# Patient Record
Sex: Female | Born: 1950
Health system: Southern US, Community
[De-identification: ages and names within clinical notes are randomized; demographics above are authoritative.]

## PROBLEM LIST (undated history)

## (undated) DIAGNOSIS — E7800 Pure hypercholesterolemia, unspecified: Secondary | ICD-10-CM

## (undated) DIAGNOSIS — D693 Immune thrombocytopenic purpura: Secondary | ICD-10-CM

## (undated) DIAGNOSIS — E039 Hypothyroidism, unspecified: Secondary | ICD-10-CM

## (undated) DIAGNOSIS — M4727 Other spondylosis with radiculopathy, lumbosacral region: Secondary | ICD-10-CM

## (undated) DIAGNOSIS — M8580 Other specified disorders of bone density and structure, unspecified site: Secondary | ICD-10-CM

## (undated) DIAGNOSIS — I10 Essential (primary) hypertension: Secondary | ICD-10-CM

## (undated) DIAGNOSIS — Z8619 Personal history of other infectious and parasitic diseases: Secondary | ICD-10-CM

## (undated) DIAGNOSIS — K635 Polyp of colon: Secondary | ICD-10-CM

## (undated) DIAGNOSIS — E042 Nontoxic multinodular goiter: Secondary | ICD-10-CM

## (undated) HISTORY — PX: FRACTURE SURGERY: SHX138

## (undated) HISTORY — PX: OTHER SURGICAL HISTORY: SHX169

## (undated) HISTORY — PX: COLONOSCOPY: SHX174

## (undated) HISTORY — PX: TUBAL LIGATION: SHX77

## (undated) HISTORY — PX: APPENDECTOMY: SHX54

## (undated) HISTORY — PX: CHOLECYSTECTOMY: SHX55

---

## 1998-07-30 HISTORY — PX: LIVER SURGERY: SHX698

## 2005-04-19 ENCOUNTER — Ambulatory Visit: Payer: Self-pay | Admitting: Internal Medicine

## 2005-04-27 ENCOUNTER — Ambulatory Visit: Payer: Self-pay | Admitting: Internal Medicine

## 2006-05-09 ENCOUNTER — Ambulatory Visit: Payer: Self-pay | Admitting: Internal Medicine

## 2006-09-25 ENCOUNTER — Ambulatory Visit: Payer: Self-pay | Admitting: Unknown Physician Specialty

## 2007-06-17 ENCOUNTER — Ambulatory Visit: Payer: Self-pay | Admitting: Internal Medicine

## 2008-08-12 ENCOUNTER — Ambulatory Visit: Payer: Self-pay | Admitting: Internal Medicine

## 2008-09-08 ENCOUNTER — Ambulatory Visit: Payer: Self-pay | Admitting: Internal Medicine

## 2008-09-29 ENCOUNTER — Ambulatory Visit: Payer: Self-pay | Admitting: Urology

## 2008-10-07 ENCOUNTER — Ambulatory Visit: Payer: Self-pay | Admitting: Urology

## 2008-10-08 ENCOUNTER — Ambulatory Visit: Payer: Self-pay | Admitting: Urology

## 2009-09-21 ENCOUNTER — Ambulatory Visit: Payer: Self-pay | Admitting: Internal Medicine

## 2010-09-25 ENCOUNTER — Ambulatory Visit: Payer: Self-pay | Admitting: Internal Medicine

## 2011-10-22 ENCOUNTER — Ambulatory Visit: Payer: Self-pay | Admitting: Internal Medicine

## 2012-02-26 ENCOUNTER — Ambulatory Visit: Payer: Self-pay | Admitting: Unknown Physician Specialty

## 2012-10-22 ENCOUNTER — Ambulatory Visit: Payer: Self-pay | Admitting: Internal Medicine

## 2013-10-27 ENCOUNTER — Ambulatory Visit: Payer: Self-pay | Admitting: Internal Medicine

## 2014-01-20 ENCOUNTER — Emergency Department: Payer: Self-pay | Admitting: Emergency Medicine

## 2015-09-10 ENCOUNTER — Emergency Department
Admission: EM | Admit: 2015-09-10 | Discharge: 2015-09-10 | Disposition: A | Discharge status: Home / Self Care | Payer: PPO | Attending: Emergency Medicine | Admitting: Emergency Medicine

## 2015-09-10 ENCOUNTER — Encounter: Payer: Self-pay | Admitting: Emergency Medicine

## 2015-09-10 ENCOUNTER — Emergency Department: Payer: PPO

## 2015-09-10 DIAGNOSIS — S52501A Unspecified fracture of the lower end of right radius, initial encounter for closed fracture: Secondary | ICD-10-CM | POA: Diagnosis not present

## 2015-09-10 DIAGNOSIS — S6991XA Unspecified injury of right wrist, hand and finger(s), initial encounter: Secondary | ICD-10-CM | POA: Diagnosis not present

## 2015-09-10 DIAGNOSIS — Y998 Other external cause status: Secondary | ICD-10-CM | POA: Insufficient documentation

## 2015-09-10 DIAGNOSIS — W108XXA Fall (on) (from) other stairs and steps, initial encounter: Secondary | ICD-10-CM | POA: Insufficient documentation

## 2015-09-10 DIAGNOSIS — Y9289 Other specified places as the place of occurrence of the external cause: Secondary | ICD-10-CM | POA: Insufficient documentation

## 2015-09-10 DIAGNOSIS — Y9389 Activity, other specified: Secondary | ICD-10-CM | POA: Diagnosis not present

## 2015-09-10 DIAGNOSIS — S52614A Nondisplaced fracture of right ulna styloid process, initial encounter for closed fracture: Secondary | ICD-10-CM | POA: Insufficient documentation

## 2015-09-10 DIAGNOSIS — S52611A Displaced fracture of right ulna styloid process, initial encounter for closed fracture: Secondary | ICD-10-CM | POA: Diagnosis not present

## 2015-09-10 DIAGNOSIS — S52591A Other fractures of lower end of right radius, initial encounter for closed fracture: Secondary | ICD-10-CM | POA: Insufficient documentation

## 2015-09-10 DIAGNOSIS — S52571A Other intraarticular fracture of lower end of right radius, initial encounter for closed fracture: Secondary | ICD-10-CM | POA: Diagnosis not present

## 2015-09-10 DIAGNOSIS — S62101A Fracture of unspecified carpal bone, right wrist, initial encounter for closed fracture: Secondary | ICD-10-CM

## 2015-09-10 MED ORDER — OXYCODONE-ACETAMINOPHEN 5-325 MG PO TABS
1.0000 | ORAL_TABLET | Freq: Once | ORAL | Status: AC
  Administered 2015-09-10: 1 via ORAL
  Filled 2015-09-10: qty 1

## 2015-09-10 MED ORDER — OXYCODONE-ACETAMINOPHEN 5-325 MG PO TABS: 1.0000 | ORAL_TABLET | Freq: Four times a day (QID) | ORAL | Status: DC | PRN

## 2015-09-10 MED ORDER — ONDANSETRON 4 MG PO TBDP
4.0000 mg | ORAL_TABLET | Freq: Once | ORAL | Status: AC
  Administered 2015-09-10: 4 mg via ORAL
  Filled 2015-09-10: qty 1

## 2015-09-10 MED ORDER — ONDANSETRON HCL 4 MG PO TABS: 4.0000 mg | ORAL_TABLET | Freq: Four times a day (QID) | ORAL | Status: DC | PRN

## 2015-09-10 NOTE — Discharge Instructions (Signed)
Cast or Splint Care Casts and splints support injured limbs and keep bones from moving while they heal.  HOME CARE  Keep the cast or splint uncovered during the drying period.  A plaster cast can take 24 to 48 hours to dry.  A fiberglass cast will dry in less than 1 hour.  Do not rest the cast on anything harder than a pillow for 24 hours.  Do not put weight on your injured limb. Do not put pressure on the cast. Wait for your doctor's approval.  Keep the cast or splint dry.  Cover the cast or splint with a plastic bag during baths or wet weather.  If you have a cast over your chest and belly (trunk), take sponge baths until the cast is taken off.  If your cast gets wet, dry it with a towel or blow dryer. Use the cool setting on the blow dryer.  Keep your cast or splint clean. Wash a dirty cast with a damp cloth.  Do not put any objects under your cast or splint.  Do not scratch the skin under the cast with an object. If itching is a problem, use a blow dryer on a cool setting over the itchy area.  Do not trim or cut your cast.  Do not take out the padding from inside your cast.  Exercise your joints near the cast as told by your doctor.  Raise (elevate) your injured limb on 1 or 2 pillows for the first 1 to 3 days. GET HELP IF:  Your cast or splint cracks.  Your cast or splint is too tight or too loose.  You itch badly under the cast.  Your cast gets wet or has a soft spot.  You have a bad smell coming from the cast.  You get an object stuck under the cast.  Your skin around the cast becomes red or sore.  You have new or more pain after the cast is put on. GET HELP RIGHT AWAY IF:  You have fluid leaking through the cast.  You cannot move your fingers or toes.  Your fingers or toes turn blue or white or are cool, painful, or puffy (swollen).  You have tingling or lose feeling (numbness) around the injured area.  You have bad pain or pressure under the  cast.  You have trouble breathing or have shortness of breath.  You have chest pain.   This information is not intended to replace advice given to you by your health care provider. Make sure you discuss any questions you have with your health care provider.   Document Released: 11/15/2010 Document Revised: 03/18/2013 Document Reviewed: 01/22/2013 Elsevier Interactive Patient Education 2016 [employer]  Wrist Fracture A wrist fracture is a break or crack in one of the bones of your wrist. Your wrist is made up of eight small bones at the palm of your hand (carpal bones) and two long bones that make up your forearm (radius and ulna). CAUSES  A direct blow to the wrist.  Falling on an outstretched hand.  Trauma, such as a car accident or a fall. RISK FACTORS Risk factors for wrist fracture include:  Participating in contact and high-risk sports, such as skiing, biking, and ice skating.  Taking steroid medicines.  Smoking.  Being female.  Being Caucasian.  Drinking more than three alcoholic beverages per day.  Having low or lowered bone density (osteoporosis or osteopenia).  Age. Older adults have decreased bone density.  Women who have  had menopause.  History of previous fractures. SIGNS AND SYMPTOMS Symptoms of wrist fractures include tenderness, bruising, and inflammation. Additionally, the wrist may hang in an odd position or appear deformed. DIAGNOSIS Diagnosis may include:  Physical exam.  X-ray. TREATMENT Treatment depends on many factors, including the nature and location of the fracture, your age, and your activity level. Treatment for wrist fracture can be nonsurgical or surgical. Nonsurgical Treatment A plaster cast or splint may be applied to your wrist if the bone is in a good position. If the fracture is not in good position, it may be necessary for your health care provider to realign it before applying a splint or cast. Usually, a cast or splint  will be worn for several weeks. Surgical Treatment Sometimes the position of the bone is so far out of place that surgery is required to apply a device to hold it together as it heals. Depending on the fracture, there are a number of options for holding the bone in place while it heals, such as a cast and metal pins. HOME CARE INSTRUCTIONS  Keep your injured wrist elevated and move your fingers as much as possible.  Do not put pressure on any part of your cast or splint. It may break.  Use a plastic bag to protect your cast or splint from water while bathing or showering. Do not lower your cast or splint into water.  Take medicines only as directed by your health care provider.  Keep your cast or splint clean and dry. If it becomes wet, damaged, or suddenly feels too tight, contact your health care provider right away.  Do not use any tobacco products including cigarettes, chewing tobacco, or electronic cigarettes. Tobacco can delay bone healing. If you need help quitting, ask your health care provider.  Keep all follow-up visits as directed by your health care provider. This is important.  Ask your health care provider if you should take supplements of calcium and vitamins C and D to promote bone healing. SEEK MEDICAL CARE IF:  Your cast or splint is damaged, breaks, or gets wet.  You have a fever.  You have chills.  You have continued severe pain or more swelling than you did before the cast was put on. SEEK IMMEDIATE MEDICAL CARE IF:  Your hand or fingernails on the injured arm turn blue or gray, or feel cold or numb.  You have decreased feeling in the fingers of your injured arm. MAKE SURE YOU:  Understand these instructions.  Will watch your condition.  Will get help right away if you are not doing well or get worse.   This information is not intended to replace advice given to you by your health care provider. Make sure you discuss any questions you have with your  health care provider.   Document Released: 04/25/2005 Document Revised: 04/06/2015 Document Reviewed: 08/03/2011 Elsevier Interactive Patient Education 2016 [place] the splint until evaluated by ortho. Take the pain medicine as directed for pain relief. Apply ice pack over the splint as needed. Keep the splint clean and dry.

## 2015-09-10 NOTE — ED Provider Notes (Signed)
[facility] Emergency Department Provider Note ____________________________________________  Time seen: 1630  I have reviewed the triage vital signs and the nursing notes.  HISTORY  Chief Complaint  Wrist Pain  HPI Jennifer Williamson is a 65 y.o. female is into the ED for evaluation of pain and deformity to her right wrist. She left hand dominant female who took a fall, about 3 rungs up on a ladder at home this morning. She was assisting her husband hanging shutters around the windows. She fell backwards, with her hands extended, noted immediate pain and deformity to her right wrist. She presents to the ED for evaluation and management. She reports her pain at a 10/10 in triage.  History reviewed. No pertinent past medical history.  There are no active problems to display for this patient.  Past Surgical History  Procedure Laterality Date  . Appendectomy      Current Outpatient Rx  Name  Route  Sig  Dispense  Refill  . ondansetron (ZOFRAN) 4 MG tablet   Oral   Take 1 tablet (4 mg total) by mouth every 6 (six) hours as needed for nausea or vomiting.   15 tablet   0   . oxyCODONE-acetaminophen (ROXICET) 5-325 MG tablet   Oral   Take 1 tablet by mouth every 6 (six) hours as needed for moderate pain or severe pain.   20 tablet   0    Allergies Review of patient's allergies indicates no known allergies.  History reviewed. No pertinent family history.  Social History Social History  Substance Use Topics  . Smoking status: Never Smoker   . Smokeless tobacco: None  . Alcohol Use: None   Review of Systems  Constitutional: Negative for fever. Eyes: Negative for visual changes. ENT: Negative for sore throat. Cardiovascular: Negative for chest pain. Respiratory: Negative for shortness of breath. Musculoskeletal: Negative for back pain. Right wrist pain as above. Skin: Negative for rash. Neurological: Negative for headaches, focal weakness or  numbness. ____________________________________________  PHYSICAL EXAM:  VITAL SIGNS: ED Triage Vitals  Enc Vitals Group     BP 09/10/15 1551 170/107 mmHg     Pulse Rate 09/10/15 1551 96     Resp 09/10/15 1551 20     Temp 09/10/15 1551 98.2 F (36.8 C)     Temp Source 09/10/15 1551 Oral     SpO2 09/10/15 1551 99 %     Weight 09/10/15 1551 160 lb (72.576 kg)     Height 09/10/15 1551 5\' 7"  (1.702 m)     Head Cir --      Peak Flow --      Pain Score 09/10/15 1552 10     Pain Loc --      Pain Edu? --      Excl. in [place]? --    Constitutional: Alert and oriented. Well appearing and in no distress. Head: Normocephalic and atraumatic. Hematological/Lymphatic/Immunological: No cervical lymphadenopathy. Cardiovascular: Normal rate, regular rhythm. Normal distal pulses to the right upper treatment. Respiratory: Normal respiratory effort. No wheezes/rales/rhonchi. Gastrointestinal: Soft and nontender. No distention. Musculoskeletal: Nontender with normal range of motion in all extremities. Right wrist with obvious dinner fork deformity distally.  Neurologic: Cranial nerves II through XII grossly intact. Normal gross sensation intact. Patient with normal intrinsic testing.  Normal gait without ataxia. Normal speech and language. No gross focal neurologic deficits are appreciated. Skin:  Skin is warm, dry and intact. No rash noted. Psychiatric: Mood and affect are normal. Patient exhibits appropriate  insight and judgment. ____________________________________________   RADIOLOGY Right Wrist IMPRESSION: Mildly comminuted intra-articular distal radial fracture.  Ulnar styloid fracture.  I, Iliana Hutt, Dannielle Karvonen, personally viewed and evaluated these images (plain radiographs) as part of my medical decision making, as well as reviewing the written report by the radiologist. ____________________________________________  PROCEDURES  Percocet 5-325mg  PO x 2 Zofran 4 mg PO Sugar Tong  OCL Arm sling ____________________________________________  INITIAL IMPRESSION / ASSESSMENT AND PLAN / ED COURSE  ----------------------------------------- 4:49 PM on 09/10/2015 -----------------------------------------  Spoke with Dr. Roland Rack. He will re-evaluate the patient in the office next week for this closed wrist fracture.   Initial fracture care is provided. Patient with a closed right wrist fracture as above. She is to follow up with Dr. Roland Rack as discussed. She was discharged with prescription for Percocet and Zofran to dose as needed for pain.  ____________________________________________  FINAL CLINICAL IMPRESSION(S) / ED DIAGNOSES  Final diagnoses:  Wrist fracture, closed, right, initial encounter      Melvenia Needles, PA-C 09/10/15 1749  Orbie Pyo, MD 09/10/15 2147

## 2015-09-10 NOTE — ED Notes (Signed)
Fell down 3 steps, pain and swelling to right wrist

## 2015-09-12 DIAGNOSIS — S52591A Other fractures of lower end of right radius, initial encounter for closed fracture: Secondary | ICD-10-CM | POA: Diagnosis not present

## 2015-09-12 DIAGNOSIS — S52611A Displaced fracture of right ulna styloid process, initial encounter for closed fracture: Secondary | ICD-10-CM | POA: Diagnosis not present

## 2015-09-12 DIAGNOSIS — M25531 Pain in right wrist: Secondary | ICD-10-CM | POA: Diagnosis not present

## 2015-09-13 ENCOUNTER — Ambulatory Visit: Payer: PPO | Admitting: Anesthesiology

## 2015-09-13 ENCOUNTER — Encounter
Admission: RE | Disposition: A | Discharge status: Home / Self Care | Payer: Self-pay | Source: Ambulatory Visit | Attending: Surgery

## 2015-09-13 ENCOUNTER — Encounter: Payer: Self-pay | Admitting: *Deleted

## 2015-09-13 ENCOUNTER — Ambulatory Visit
Admission: RE | Admit: 2015-09-13 | Discharge: 2015-09-13 | Disposition: A | Discharge status: Home / Self Care | Payer: PPO | Source: Ambulatory Visit | Attending: Surgery | Admitting: Surgery

## 2015-09-13 DIAGNOSIS — Z833 Family history of diabetes mellitus: Secondary | ICD-10-CM | POA: Diagnosis not present

## 2015-09-13 DIAGNOSIS — W11XXXA Fall on and from ladder, initial encounter: Secondary | ICD-10-CM | POA: Insufficient documentation

## 2015-09-13 DIAGNOSIS — Z9049 Acquired absence of other specified parts of digestive tract: Secondary | ICD-10-CM | POA: Diagnosis not present

## 2015-09-13 DIAGNOSIS — Z8371 Family history of colonic polyps: Secondary | ICD-10-CM | POA: Insufficient documentation

## 2015-09-13 DIAGNOSIS — M8580 Other specified disorders of bone density and structure, unspecified site: Secondary | ICD-10-CM | POA: Diagnosis not present

## 2015-09-13 DIAGNOSIS — Z8249 Family history of ischemic heart disease and other diseases of the circulatory system: Secondary | ICD-10-CM | POA: Insufficient documentation

## 2015-09-13 DIAGNOSIS — Y92008 Other place in unspecified non-institutional (private) residence as the place of occurrence of the external cause: Secondary | ICD-10-CM | POA: Insufficient documentation

## 2015-09-13 DIAGNOSIS — S52531A Colles' fracture of right radius, initial encounter for closed fracture: Secondary | ICD-10-CM | POA: Diagnosis not present

## 2015-09-13 DIAGNOSIS — Y9389 Activity, other specified: Secondary | ICD-10-CM | POA: Diagnosis not present

## 2015-09-13 DIAGNOSIS — S52511A Displaced fracture of right radial styloid process, initial encounter for closed fracture: Secondary | ICD-10-CM | POA: Diagnosis not present

## 2015-09-13 DIAGNOSIS — E079 Disorder of thyroid, unspecified: Secondary | ICD-10-CM | POA: Insufficient documentation

## 2015-09-13 DIAGNOSIS — S52501A Unspecified fracture of the lower end of right radius, initial encounter for closed fracture: Secondary | ICD-10-CM | POA: Diagnosis not present

## 2015-09-13 DIAGNOSIS — Z79899 Other long term (current) drug therapy: Secondary | ICD-10-CM | POA: Diagnosis not present

## 2015-09-13 HISTORY — PX: OPEN REDUCTION INTERNAL FIXATION (ORIF) DISTAL RADIAL FRACTURE: SHX5989

## 2015-09-13 SURGERY — OPEN REDUCTION INTERNAL FIXATION (ORIF) DISTAL RADIUS FRACTURE
Anesthesia: General | Laterality: Right | Wound class: Clean

## 2015-09-13 MED ORDER — BUPIVACAINE HCL (PF) 0.5 % IJ SOLN
INTRAMUSCULAR | Status: DC | PRN
  Administered 2015-09-13: 10 mL

## 2015-09-13 MED ORDER — BUPIVACAINE HCL (PF) 0.5 % IJ SOLN: INTRAMUSCULAR | Status: DC | PRN

## 2015-09-13 MED ORDER — PROPOFOL 10 MG/ML IV BOLUS
INTRAVENOUS | Status: DC | PRN
  Administered 2015-09-13: 130 mg via INTRAVENOUS

## 2015-09-13 MED ORDER — METOCLOPRAMIDE HCL 5 MG/ML IJ SOLN: 5.0000 mg | Freq: Three times a day (TID) | INTRAMUSCULAR | Status: DC | PRN

## 2015-09-13 MED ORDER — NEOMYCIN-POLYMYXIN B GU 40-200000 IR SOLN
Status: DC | PRN
  Administered 2015-09-13: 2 mL

## 2015-09-13 MED ORDER — CEFAZOLIN SODIUM-DEXTROSE 2-3 GM-% IV SOLR
2.0000 g | Freq: Three times a day (TID) | INTRAVENOUS | Status: DC
  Administered 2015-09-13: 2 g via INTRAVENOUS

## 2015-09-13 MED ORDER — ONDANSETRON HCL 4 MG/2ML IJ SOLN
INTRAMUSCULAR | Status: DC | PRN
  Administered 2015-09-13: 4 mg via INTRAVENOUS

## 2015-09-13 MED ORDER — ONDANSETRON HCL 4 MG PO TABS: 4.0000 mg | ORAL_TABLET | Freq: Four times a day (QID) | ORAL | Status: DC | PRN

## 2015-09-13 MED ORDER — CEFAZOLIN SODIUM-DEXTROSE 2-3 GM-% IV SOLR
INTRAVENOUS | Status: AC
  Filled 2015-09-13: qty 50

## 2015-09-13 MED ORDER — OXYCODONE HCL 5 MG PO TABS
5.0000 mg | ORAL_TABLET | ORAL | Status: DC | PRN
  Administered 2015-09-13: 5 mg via ORAL

## 2015-09-13 MED ORDER — NEOMYCIN-POLYMYXIN B GU 40-200000 IR SOLN
Status: AC
  Filled 2015-09-13: qty 2

## 2015-09-13 MED ORDER — OXYCODONE HCL 5 MG PO TABS: 5.0000 mg | ORAL_TABLET | ORAL | Status: DC | PRN

## 2015-09-13 MED ORDER — METOCLOPRAMIDE HCL 10 MG PO TABS: 5.0000 mg | ORAL_TABLET | Freq: Three times a day (TID) | ORAL | Status: DC | PRN

## 2015-09-13 MED ORDER — OXYCODONE HCL 5 MG PO TABS
ORAL_TABLET | ORAL | Status: AC
  Administered 2015-09-13: 5 mg via ORAL
  Filled 2015-09-13: qty 1

## 2015-09-13 MED ORDER — DEXTROSE 5 % IV SOLN: 2000.0000 mg | Freq: Once | INTRAVENOUS | Status: DC

## 2015-09-13 MED ORDER — FENTANYL CITRATE (PF) 100 MCG/2ML IJ SOLN
INTRAMUSCULAR | Status: AC
  Administered 2015-09-13: 25 ug via INTRAVENOUS
  Filled 2015-09-13: qty 2

## 2015-09-13 MED ORDER — FENTANYL CITRATE (PF) 100 MCG/2ML IJ SOLN
INTRAMUSCULAR | Status: AC
  Filled 2015-09-13: qty 2

## 2015-09-13 MED ORDER — MIDAZOLAM HCL 2 MG/2ML IJ SOLN
INTRAMUSCULAR | Status: DC | PRN
  Administered 2015-09-13: 2 mg via INTRAVENOUS

## 2015-09-13 MED ORDER — PHENYLEPHRINE HCL 10 MG/ML IJ SOLN
INTRAMUSCULAR | Status: DC | PRN
  Administered 2015-09-13 (×5): 200 ug via INTRAVENOUS
  Administered 2015-09-13: 100 ug via INTRAVENOUS
  Administered 2015-09-13 (×2): 200 ug via INTRAVENOUS

## 2015-09-13 MED ORDER — LIDOCAINE HCL (CARDIAC) 20 MG/ML IV SOLN
INTRAVENOUS | Status: DC | PRN
  Administered 2015-09-13: 80 mg via INTRAVENOUS

## 2015-09-13 MED ORDER — ONDANSETRON HCL 4 MG/2ML IJ SOLN: 4.0000 mg | Freq: Four times a day (QID) | INTRAMUSCULAR | Status: DC | PRN

## 2015-09-13 MED ORDER — LACTATED RINGERS IV SOLN
INTRAVENOUS | Status: DC
  Administered 2015-09-13: 12:00:00 via INTRAVENOUS

## 2015-09-13 MED ORDER — BUPIVACAINE HCL (PF) 0.5 % IJ SOLN
INTRAMUSCULAR | Status: AC
  Filled 2015-09-13: qty 30

## 2015-09-13 MED ORDER — DEXAMETHASONE SODIUM PHOSPHATE 10 MG/ML IJ SOLN
INTRAMUSCULAR | Status: DC | PRN
  Administered 2015-09-13: 10 mg via INTRAVENOUS

## 2015-09-13 MED ORDER — FENTANYL CITRATE (PF) 100 MCG/2ML IJ SOLN
INTRAMUSCULAR | Status: DC | PRN
  Administered 2015-09-13 (×2): 50 ug via INTRAVENOUS

## 2015-09-13 MED ORDER — ONDANSETRON HCL 4 MG/2ML IJ SOLN: 4.0000 mg | Freq: Once | INTRAMUSCULAR | Status: DC | PRN

## 2015-09-13 MED ORDER — FENTANYL CITRATE (PF) 100 MCG/2ML IJ SOLN
25.0000 ug | INTRAMUSCULAR | Status: AC | PRN
  Administered 2015-09-13 (×6): 25 ug via INTRAVENOUS

## 2015-09-13 MED ORDER — GLYCOPYRROLATE 0.2 MG/ML IJ SOLN
INTRAMUSCULAR | Status: DC | PRN
  Administered 2015-09-13: .2 mg via INTRAVENOUS

## 2015-09-13 SURGICAL SUPPLY — 53 items
BIT DRILL 2.2 SS TIBIAL (BIT) ×3 IMPLANT
BNDG COHESIVE 4X5 TAN STRL (GAUZE/BANDAGES/DRESSINGS) ×6 IMPLANT
BNDG COHESIVE 4X5 WHT NS (GAUZE/BANDAGES/DRESSINGS) ×3 IMPLANT
BNDG ESMARK 4X12 TAN STRL LF (GAUZE/BANDAGES/DRESSINGS) ×3 IMPLANT
CANISTER SUCT 1200ML W/VALVE (MISCELLANEOUS) ×3 IMPLANT
CHLORAPREP W/TINT 26ML (MISCELLANEOUS) ×6 IMPLANT
CLOSURE WOUND 1/2 X4 (GAUZE/BANDAGES/DRESSINGS) ×1
CORD BIP STRL DISP 12FT (MISCELLANEOUS) ×3 IMPLANT
CUFF TOURN SGL QUICK 18 (TOURNIQUET CUFF) ×3 IMPLANT
DRAPE FLUOR MINI C-ARM 54X84 (DRAPES) ×3 IMPLANT
DRAPE IMP U-DRAPE 54X76 (DRAPES) ×3 IMPLANT
DRAPE SURG 17X11 SM STRL (DRAPES) ×3 IMPLANT
DRAPE U-SHAPE 47X51 STRL (DRAPES) ×3 IMPLANT
ELECT CAUTERY BLADE 6.4 (BLADE) ×3 IMPLANT
ELECT REM PT RETURN 9FT ADLT (ELECTROSURGICAL) ×3
ELECTRODE REM PT RTRN 9FT ADLT (ELECTROSURGICAL) ×1 IMPLANT
FORCEPS JEWEL BIP 4-3/4 STR (INSTRUMENTS) ×3 IMPLANT
GAUZE PETRO XEROFOAM 1X8 (MISCELLANEOUS) ×3 IMPLANT
GAUZE SPONGE 4X4 12PLY STRL (GAUZE/BANDAGES/DRESSINGS) ×3 IMPLANT
GLOVE BIO SURGEON STRL SZ8 (GLOVE) ×3 IMPLANT
GLOVE INDICATOR 8.0 STRL GRN (GLOVE) ×3 IMPLANT
GOWN STRL REUS W/ TWL LRG LVL3 (GOWN DISPOSABLE) ×1 IMPLANT
GOWN STRL REUS W/ TWL XL LVL3 (GOWN DISPOSABLE) ×1 IMPLANT
GOWN STRL REUS W/TWL LRG LVL3 (GOWN DISPOSABLE) ×2
GOWN STRL REUS W/TWL XL LVL3 (GOWN DISPOSABLE) ×2
K-WIRE 1.6 (WIRE) ×2
K-WIRE FX5X1.6XNS BN SS (WIRE) ×1
KIT RM TURNOVER STRD PROC AR (KITS) ×3 IMPLANT
KWIRE FX5X1.6XNS BN SS (WIRE) ×1 IMPLANT
NEEDLE FILTER BLUNT 18X 1/2SAF (NEEDLE) ×2
NEEDLE FILTER BLUNT 18X1 1/2 (NEEDLE) ×1 IMPLANT
NS IRRIG 500ML POUR BTL (IV SOLUTION) ×3 IMPLANT
PACK EXTREMITY ARMC (MISCELLANEOUS) ×3 IMPLANT
PAD CAST CTTN 4X4 STRL (SOFTGOODS) ×2 IMPLANT
PADDING CAST COTTON 4X4 STRL (SOFTGOODS) ×4
PEG LOCKING SMOOTH 2.2X14 (Peg) ×3 IMPLANT
PEG LOCKING SMOOTH 2.2X18 (Peg) ×9 IMPLANT
PEG LOCKING SMOOTH 2.2X20 (Screw) ×6 IMPLANT
PLATE CROSSLOCK MINI RT (Plate) ×3 IMPLANT
SCREW  LP NL 2.7X15MM (Screw) ×6 IMPLANT
SCREW 2.7X12MM (Screw) ×3 IMPLANT
SCREW LP NL 2.7X15MM (Screw) ×3 IMPLANT
SCREW NONLOCK 2.7X20MM (Screw) ×3 IMPLANT
STAPLER SKIN PROX 35W (STAPLE) ×3 IMPLANT
STOCKINETTE IMPERVIOUS 9X36 MD (GAUZE/BANDAGES/DRESSINGS) ×6 IMPLANT
STRIP CLOSURE SKIN 1/2X4 (GAUZE/BANDAGES/DRESSINGS) ×2 IMPLANT
SUT PROLENE 4 0 PS 2 18 (SUTURE) ×3 IMPLANT
SUT VIC AB 2-0 SH 27 (SUTURE) ×2
SUT VIC AB 2-0 SH 27XBRD (SUTURE) ×1 IMPLANT
SUT VIC AB 3-0 SH 27 (SUTURE) ×2
SUT VIC AB 3-0 SH 27X BRD (SUTURE) ×1 IMPLANT
SWABSTK COMLB BENZOIN TINCTURE (MISCELLANEOUS) ×3 IMPLANT
SYRINGE 10CC LL (SYRINGE) ×3 IMPLANT

## 2015-09-13 NOTE — Anesthesia Preprocedure Evaluation (Addendum)
Anesthesia Evaluation  Patient identified by MRN, date of birth, ID band Patient awake    Reviewed: Allergy & Precautions, NPO status , Patient's Chart, lab work & pertinent test results, reviewed documented beta blocker date and time   Airway Mallampati: II  TM Distance: >3 FB     Dental  (+) Chipped   Pulmonary           Cardiovascular      Neuro/Psych    GI/Hepatic   Endo/Other    Renal/GU      Musculoskeletal   Abdominal   Peds  Hematology   Anesthesia Other Findings   Reproductive/Obstetrics                             Anesthesia Physical Anesthesia Plan  ASA: II  Anesthesia Plan: General   Post-op Pain Management:    Induction: Intravenous  Airway Management Planned: LMA  Additional Equipment:   Intra-op Plan:   Post-operative Plan:   Informed Consent: I have reviewed the patients History and Physical, chart, labs and discussed the procedure including the risks, benefits and alternatives for the proposed anesthesia with the patient or authorized representative who has indicated his/her understanding and acceptance.     Plan Discussed with: CRNA  Anesthesia Plan Comments:         Anesthesia Quick Evaluation  

## 2015-09-13 NOTE — Discharge Instructions (Addendum)
Keep splint dry and intact. °Keep hand elevated above heart level. °Apply ice to affected area frequently. °Return for follow-up in 10-14 days or as scheduled. ° °AMBULATORY SURGERY  °DISCHARGE INSTRUCTIONS ° ° °1) The drugs that you were given will stay in your system until tomorrow so for the next 24 hours you should not: ° °A) Drive an automobile °B) Make any legal decisions °C) Drink any alcoholic beverage ° ° °2) You may resume regular meals tomorrow.  Today it is better to start with liquids and gradually work up to solid foods. ° °You may eat anything you prefer, but it is better to start with liquids, then soup and crackers, and gradually work up to solid foods. ° ° °3) Please notify your doctor immediately if you have any unusual bleeding, trouble breathing, redness and pain at the surgery site, drainage, fever, or pain not relieved by medication. ° ° ° °4) Additional Instructions: ° ° ° ° ° ° ° °Please contact your physician with any problems or Same Day Surgery at [phone], Monday through Friday 6 am to 4 pm, or [place] [facility] number at [phone]. °

## 2015-09-13 NOTE — Op Note (Signed)
Pre-Op Diagnosis:   Closed acute intra-articular distal radius fracture with ulnar styloid fracture, right wrist.  Post-Op Diagnosis:   Same.  Procedure:   Open reduction and internal fixation of right distal radius fracture.  Surgeon:   Pascal Lux, MD  Assistant:   Lorn Junes, PA-S  Anesthesia:   General LMA  Findings:   As above.  Complications:   None  EBL:   5 cc  Fluids:   600 cc crystalloid  TT:   75 minutes at 250 mmHg  Drains:   None  Closure:   3-0 Vicryl subcuticular sutures  Implants:   Biomet DVR Cross-locked narrow precontoured distal radius mini-locking plate.  Brief Clinical Note:   The patient is a 65 year old female who sustained the above-noted injury 2 days ago when she fell off a ladder onto her outstretched right hand. She was brought to the emergency room where x-rays demonstrated the above-noted injury. She was splinted and presents at this time for definitive management of her injury.  Procedure:   The patient was brought into the operating room and lain in the supine position. After adequate general laryngal mask anesthesia was obtained, a timeout was performed to verify the appropriate surgical site. The patient's right hand and upper extremity were prepped with ChloraPrep solution before being draped sterilely. Preoperative antibiotics were administered. The limb was exsanguinated with an Esmarch and the tourniquet inflated to 250 mmHg. An approximately 7-8 cm incision was made over the volar aspect of the distal radius beginning at the volar flexion crease and extending proximally along the flexor carpi radialis tendon. The incision was carried down through the subcutaneous tissues to expose the superficial retinaculum. This was split the length of the incision directly over the flexor carpi radialis tendon. The FCR sheath was opened and the tendon retracted ulnarly to protect the median nerve. The floor of the FCR sheath was opened to expose the  pronator quadratus muscle. This was released along the radial insertion and the muscle was retracted ulnarly to expose the distal radius. The fracture was identified and soft tissues elevated off the distal metaphyseal region for several centimeters. The appropriate sized plate was selected and positioned on the distal radius. A guidewire was placed through the distal hole and its position verified using or the scan imaging in AP and lateral projections. After several attempts, the pin was positioned parallel to the distal articular surface and approximately 3-4 mm proximal to the articular surface. The plate was carefully lowered onto the volar metaphyseal surface, reducing the fracture in the process. Again the position of the plate was verified using FluoroScan imaging in AP and lateral projections and found to be excellent. The plate was secured using a nonlocking bicortical screw proximally. Distally, a nonlocking cortical screw was placed in the ulnar-most hole of the more proximal row. The other holes were filled with locking pegs of the appropriate lengths. The adequacy of hardware position and fracture reduction was verified using FluoroScan imaging in AP, lateral, and several additional oblique projections to be sure that the hardware did not enter the joint nor did it penetrate dorsally. Two additional bicortical nonlocking screws were placed proximally to secure the plate to the metaphyseal region proximally. Again the construct was assessed using FluoroScan imaging in AP, lateral, and oblique projections and found to be excellent.  The wound was copiously irrigated with sterile saline solution before the pronator quadratus was reapproximated using 2-0 Vicryl interrupted sutures. The subcutaneous tissues were closed using 2-0 Vicryl  interrupted sutures before the subcuticular layer was closed using 3-0 Vicryl inverted interrupted sutures. Benzoin and Steri-Strips are applied to the skin. A total of 10  cc of 0.5% plain Sensorcaine was injected in and around the incision site to help with postoperative analgesia before a sterile bulky dressing was applied to the wound. The patient was placed into a volar splint maintaining the wrist in neutral position before the patient was awakened, extubated, and returned to the recovery room in satisfactory condition after tolerating the procedure well.

## 2015-09-13 NOTE — Transfer of Care (Signed)
Immediate Anesthesia Transfer of Care Note  Patient: Jennifer Williamson  Procedure(s) Performed: Procedure(s): OPEN REDUCTION INTERNAL FIXATION (ORIF) DISTAL RADIAL FRACTURE (Right)  Patient Location: PACU  Anesthesia Type:General  Level of Consciousness: awake, alert , oriented and patient cooperative  Airway & Oxygen Therapy: Patient Spontanous Breathing and Patient connected to face mask oxygen  Post-op Assessment: Report given to RN, Post -op Vital signs reviewed and stable and Patient moving all extremities X 4  Post vital signs: Reviewed and stable  Last Vitals:  Filed Vitals:   09/13/15 1116 09/13/15 1623  BP: 158/92 137/80  Pulse: 86 99  Temp: 36.7 C 36.2 C  Resp: 16 12    Complications: No apparent anesthesia complications

## 2015-09-13 NOTE — H&P (Signed)
Paper H&P to be scanned into permanent record. H&P reviewed. No changes. 

## 2015-09-13 NOTE — Anesthesia Procedure Notes (Signed)
Procedure Name: LMA Insertion Date/Time: 09/13/2015 2:35 PM Performed by: Silvana Newness Pre-anesthesia Checklist: Patient identified, Emergency Drugs available, Suction available, Patient being monitored and Timeout performed Patient Re-evaluated:Patient Re-evaluated prior to inductionOxygen Delivery Method: Circle system utilized Preoxygenation: Pre-oxygenation with 100% oxygen Intubation Type: IV induction Ventilation: Mask ventilation without difficulty LMA: LMA inserted LMA Size: 4.0 Number of attempts: 1 Placement Confirmation: positive ETCO2 and breath sounds checked- equal and bilateral Tube secured with: Tape Dental Injury: Teeth and Oropharynx as per pre-operative assessment

## 2015-09-13 NOTE — Anesthesia Postprocedure Evaluation (Signed)
Anesthesia Post Note  Patient: ZEYAH COBO  Procedure(s) Performed: Procedure(s) (LRB): OPEN REDUCTION INTERNAL FIXATION (ORIF) DISTAL RADIAL FRACTURE (Right)  Patient location during evaluation: PACU Anesthesia Type: General Level of consciousness: awake Pain management: satisfactory to patient Vital Signs Assessment: post-procedure vital signs reviewed and stable Respiratory status: respiratory function stable and patient connected to nasal cannula oxygen Cardiovascular status: blood pressure returned to baseline Anesthetic complications: no    Last Vitals:  Filed Vitals:   09/13/15 1116 09/13/15 1623  BP: 158/92 137/80  Pulse: 86 99  Temp: 36.7 C 36.2 C  Resp: 16 12    Last Pain:  Filed Vitals:   09/13/15 1626  PainSc: Asleep                 VAN STAVEREN,Tiearra Colwell

## 2015-09-14 ENCOUNTER — Encounter: Payer: Self-pay | Admitting: Surgery

## 2015-09-23 DIAGNOSIS — S52611D Displaced fracture of right ulna styloid process, subsequent encounter for closed fracture with routine healing: Secondary | ICD-10-CM | POA: Diagnosis not present

## 2015-09-23 DIAGNOSIS — S52591D Other fractures of lower end of right radius, subsequent encounter for closed fracture with routine healing: Secondary | ICD-10-CM | POA: Diagnosis not present

## 2015-09-23 DIAGNOSIS — S52591A Other fractures of lower end of right radius, initial encounter for closed fracture: Secondary | ICD-10-CM | POA: Diagnosis not present

## 2015-09-26 ENCOUNTER — Other Ambulatory Visit: Payer: Self-pay | Admitting: Internal Medicine

## 2015-09-26 DIAGNOSIS — E042 Nontoxic multinodular goiter: Secondary | ICD-10-CM | POA: Diagnosis not present

## 2015-09-26 DIAGNOSIS — Z1231 Encounter for screening mammogram for malignant neoplasm of breast: Secondary | ICD-10-CM | POA: Diagnosis not present

## 2015-09-26 DIAGNOSIS — Z23 Encounter for immunization: Secondary | ICD-10-CM | POA: Diagnosis not present

## 2015-09-26 DIAGNOSIS — Z0000 Encounter for general adult medical examination without abnormal findings: Secondary | ICD-10-CM | POA: Diagnosis not present

## 2015-09-26 DIAGNOSIS — D693 Immune thrombocytopenic purpura: Secondary | ICD-10-CM | POA: Diagnosis not present

## 2015-09-26 DIAGNOSIS — E7800 Pure hypercholesterolemia, unspecified: Secondary | ICD-10-CM | POA: Diagnosis not present

## 2015-10-24 ENCOUNTER — Other Ambulatory Visit: Payer: Self-pay | Admitting: Internal Medicine

## 2015-10-24 ENCOUNTER — Ambulatory Visit
Admission: RE | Admit: 2015-10-24 | Discharge: 2015-10-24 | Disposition: A | Discharge status: Home / Self Care | Payer: PPO | Source: Ambulatory Visit | Attending: Internal Medicine | Admitting: Internal Medicine

## 2015-10-24 DIAGNOSIS — S52531D Colles' fracture of right radius, subsequent encounter for closed fracture with routine healing: Secondary | ICD-10-CM | POA: Diagnosis not present

## 2015-10-24 DIAGNOSIS — Z1231 Encounter for screening mammogram for malignant neoplasm of breast: Secondary | ICD-10-CM | POA: Insufficient documentation

## 2015-10-24 DIAGNOSIS — M65312 Trigger thumb, left thumb: Secondary | ICD-10-CM | POA: Diagnosis not present

## 2015-10-31 DIAGNOSIS — H401131 Primary open-angle glaucoma, bilateral, mild stage: Secondary | ICD-10-CM | POA: Diagnosis not present

## 2015-11-18 DIAGNOSIS — M65312 Trigger thumb, left thumb: Secondary | ICD-10-CM | POA: Diagnosis not present

## 2015-11-26 ENCOUNTER — Emergency Department: Payer: PPO

## 2015-11-26 ENCOUNTER — Encounter: Payer: Self-pay | Admitting: Emergency Medicine

## 2015-11-26 ENCOUNTER — Emergency Department
Admission: EM | Admit: 2015-11-26 | Discharge: 2015-11-26 | Disposition: A | Discharge status: Home / Self Care | Payer: PPO | Attending: Emergency Medicine | Admitting: Emergency Medicine

## 2015-11-26 DIAGNOSIS — S42302A Unspecified fracture of shaft of humerus, left arm, initial encounter for closed fracture: Secondary | ICD-10-CM | POA: Insufficient documentation

## 2015-11-26 DIAGNOSIS — Y939 Activity, unspecified: Secondary | ICD-10-CM | POA: Diagnosis not present

## 2015-11-26 DIAGNOSIS — Y92190 Kitchen in other specified residential institution as the place of occurrence of the external cause: Secondary | ICD-10-CM | POA: Insufficient documentation

## 2015-11-26 DIAGNOSIS — W010XXA Fall on same level from slipping, tripping and stumbling without subsequent striking against object, initial encounter: Secondary | ICD-10-CM | POA: Diagnosis not present

## 2015-11-26 DIAGNOSIS — S42212A Unspecified displaced fracture of surgical neck of left humerus, initial encounter for closed fracture: Secondary | ICD-10-CM | POA: Diagnosis not present

## 2015-11-26 DIAGNOSIS — Y999 Unspecified external cause status: Secondary | ICD-10-CM | POA: Diagnosis not present

## 2015-11-26 DIAGNOSIS — S4992XA Unspecified injury of left shoulder and upper arm, initial encounter: Secondary | ICD-10-CM | POA: Diagnosis not present

## 2015-11-26 NOTE — ED Notes (Signed)
Reports slipped in the kitchen this am , pain to left shoulder

## 2015-11-26 NOTE — ED Provider Notes (Signed)
[facility] Emergency Department Provider Note  ____________________________________________  Time seen: Approximately 10:59 AM  I have reviewed the triage vital signs and the nursing notes.   HISTORY  Chief Complaint Shoulder Injury    HPI Jennifer Williamson is a 65 y.o. female , NAD, presents to the emergency department with left shoulder pain afterfalling in her kitchen this morning. States she was mopping the floor barefooted and slipped. States that her left arm went straight out and she heard and felt a "crack". Has had pain in the upper left arm since that time. He is unable to move the left arm without severe pain. Her husband accompanies and states the patient took a tablet of oxycodone approximately one hour ago which has helped the pain some. Denies any numbness, weakness, tingling of the left arm. Denies visual changes, loss of consciousness, dizziness, chest pain or shortness of breath that caused the fall. Denies head injury during the fall.   History reviewed. No pertinent past medical history.  There are no active problems to display for this patient.   Past Surgical History  Procedure Laterality Date  . Appendectomy    . Open reduction internal fixation (orif) distal radial fracture Right 09/13/2015    Procedure: OPEN REDUCTION INTERNAL FIXATION (ORIF) DISTAL RADIAL FRACTURE;  Surgeon: Corky Mull, MD;  Location: [facility] ORS;  Service: Orthopedics;  Laterality: Right;    Current Outpatient Rx  Name  Route  Sig  Dispense  Refill  . levothyroxine (SYNTHROID, LEVOTHROID) 88 MCG tablet   Oral   Take 88 mcg by mouth daily before breakfast.         . ondansetron (ZOFRAN) 4 MG tablet   Oral   Take 1 tablet (4 mg total) by mouth every 6 (six) hours as needed for nausea or vomiting.   15 tablet   0   . oxyCODONE (ROXICODONE) 5 MG immediate release tablet   Oral   Take 1-2 tablets (5-10 mg total) by mouth every 4 (four) hours as needed for  severe pain.   50 tablet   0     Allergies Review of patient's allergies indicates no known allergies.  Family History  Problem Relation Age of Onset  . Breast cancer Neg Hx     Social History Social History  Substance Use Topics  . Smoking status: Never Smoker   . Smokeless tobacco: None  . Alcohol Use: None     Review of Systems  Constitutional: No fever/chills, fatigue Eyes: No visual changes.  Cardiovascular: No chest pain. Respiratory:  No shortness of breath.  Gastrointestinal: No abdominal pain.  No nausea, vomiting.   Musculoskeletal: Positive left shoulder pain. Negative elbow, hand, wrist pain Skin: Negative for rash, open wounds, bruising. Neurological: Negative for headaches, focal weakness or numbness. No tingling 10-point ROS otherwise negative.  ____________________________________________   PHYSICAL EXAM:  VITAL SIGNS: ED Triage Vitals  Enc Vitals Group     BP 11/26/15 1014 189/92 mmHg     Pulse Rate 11/26/15 1014 81     Resp 11/26/15 1014 18     Temp 11/26/15 1014 97.9 F (36.6 C)     Temp Source 11/26/15 1014 Oral     SpO2 11/26/15 1014 100 %     Weight --      Height --      Head Cir --      Peak Flow --      Pain Score 11/26/15 1007 9  Pain Loc --      Pain Edu? --      Excl. in [place]? --      Constitutional: Alert and oriented. Well appearing and in no acute distress. Eyes: Conjunctivae are normal.  Head: Atraumatic. Neck: Supple with full range of motion. Cardiovascular: Good peripheral circulation noting 2+ pulses in the left upper extremity. Capillary refill less than 3 seconds in all digits of the left upper extremity.Marland Kitchen Respiratory: Normal respiratory effort without tachypnea or retractions.  Musculoskeletal: Significant tenderness to palpation about the proximal humerus. Patient cannot move the left shoulder without severe pain and is in an immobilizer at this time. Range of motion of the left hand, wrist, fingers. No joint  effusions. Neurologic:  Normal speech and language. No gross focal neurologic deficits are appreciated.  Skin:  Skin is warm, dry and intact. No rash noted. Psychiatric: Mood and affect are normal. Speech and behavior are normal. Patient exhibits appropriate insight and judgement.   ____________________________________________   LABS  None ____________________________________________  EKG  None ____________________________________________  RADIOLOGY I have personally viewed and evaluated these images (plain radiographs) as part of my medical decision making, as well as reviewing the written report by the radiologist.  Dg Shoulder Left  11/26/2015  CLINICAL DATA:  65 year old female with a history of fall. Shoulder pain EXAM: LEFT SHOULDER - 2+ VIEW COMPARISON:  None. FINDINGS: Acute fracture of the proximal humerus, surgical neck, with vertical extension through the tuberosity. IMPRESSION: Acute fracture of the surgical left humerus, with vertical fracture fragment through the tuberosity. Signed, Dulcy Fanny. Earleen Newport, DO [facility] Radiology Electronically Signed   By: Corrie Mckusick D.O.   On: 11/26/2015 10:43    ____________________________________________    PROCEDURES  Procedure(s) performed: None    Medications - No data to display   ____________________________________________   INITIAL IMPRESSION / ASSESSMENT AND PLAN / ED COURSE  Pertinent imaging results that were available during my care of the patient were reviewed by me and considered in my medical decision making (see chart for details).  Patient's diagnosis is consistent with left humeral fracture that is closed. I spoke with Dr. Roland Rack who advised that the patient go into a shoulder immobilizer and follow up with him in his clinic early next week. Patient was made aware of such. She currently has 15+ pills of oxycodone 5mg   from a previous surgery and may take  that as previously prescribed for pain.  Patient is given ED precautions to return to the ED for any worsening or new symptoms.    ____________________________________________  FINAL CLINICAL IMPRESSION(S) / ED DIAGNOSES  Final diagnoses:  Left humeral fracture, closed, initial encounter      NEW MEDICATIONS STARTED DURING THIS VISIT:  New Prescriptions   No medications on file         Braxton Feathers, PA-C 11/26/15 [place], MD 11/26/15 1504

## 2015-11-26 NOTE — ED Notes (Signed)
Ice pack applied.

## 2015-11-26 NOTE — Discharge Instructions (Signed)
Humerus Fracture Treated With Immobilization °The humerus is the large bone in your upper arm. You have a broken (fractured) humerus. These fractures are easily diagnosed with X-rays. °TREATMENT  °Simple fractures which will heal without disability are treated with simple immobilization. Immobilization means you will wear a cast, splint, or sling. You have a fracture which will do well with immobilization. The fracture will heal well simply by being held in a good position until it is stable enough to begin range of motion exercises. Do not take part in activities which would further injure your arm.  °HOME CARE INSTRUCTIONS  °· Put ice on the injured area. °¨ Put ice in a plastic bag. °¨ Place a towel between your skin and the bag. °¨ Leave the ice on for 15-20 minutes, 03-04 times a day. °· If you have a cast: °¨ Do not scratch the skin under the cast using sharp or pointed objects. °¨ Check the skin around the cast every day. You may put lotion on any red or sore areas. °¨ Keep your cast dry and clean. °· If you have a splint: °¨ Wear the splint as directed. °¨ Keep your splint dry and clean. °¨ You may loosen the elastic around the splint if your fingers become numb, tingle, or turn cold or blue. °· If you have a sling: °¨ Wear the sling as directed. °· Do not put pressure on any part of your cast or splint until it is fully hardened. °· Your cast or splint can be protected during bathing with a plastic bag. Do not lower the cast or splint into water. °· Only take over-the-counter or prescription medicines for pain, discomfort, or fever as directed by your caregiver. °· Do range of motion exercises as instructed by your caregiver. °· Follow up as directed by your caregiver. This is very important in order to avoid permanent injury or disability and chronic pain. °SEEK IMMEDIATE MEDICAL CARE IF:  °· Your skin or nails in the injured arm turn blue or gray. °· Your arm feels cold or numb. °· You develop severe pain  in the injured arm. °· You are having problems with the medicines you were given. °MAKE SURE YOU:  °· Understand these instructions. °· Will watch your condition. °· Will get help right away if you are not doing well or get worse. °  °This information is not intended to replace advice given to you by your health care provider. Make sure you discuss any questions you have with your health care provider. °  °Document Released: 10/22/2000 Document Revised: 08/06/2014 Document Reviewed: 12/08/2014 °Elsevier Interactive Patient Education ©2016 [employer] ° °

## 2015-11-28 DIAGNOSIS — S42225A 2-part nondisplaced fracture of surgical neck of left humerus, initial encounter for closed fracture: Secondary | ICD-10-CM | POA: Diagnosis not present

## 2015-12-05 DIAGNOSIS — S42225D 2-part nondisplaced fracture of surgical neck of left humerus, subsequent encounter for fracture with routine healing: Secondary | ICD-10-CM | POA: Diagnosis not present

## 2015-12-05 DIAGNOSIS — S52531D Colles' fracture of right radius, subsequent encounter for closed fracture with routine healing: Secondary | ICD-10-CM | POA: Diagnosis not present

## 2015-12-21 DIAGNOSIS — S42225D 2-part nondisplaced fracture of surgical neck of left humerus, subsequent encounter for fracture with routine healing: Secondary | ICD-10-CM | POA: Diagnosis not present

## 2015-12-21 DIAGNOSIS — M25512 Pain in left shoulder: Secondary | ICD-10-CM | POA: Diagnosis not present

## 2015-12-27 DIAGNOSIS — M8580 Other specified disorders of bone density and structure, unspecified site: Secondary | ICD-10-CM | POA: Diagnosis not present

## 2016-01-02 DIAGNOSIS — M25512 Pain in left shoulder: Secondary | ICD-10-CM | POA: Diagnosis not present

## 2016-01-05 DIAGNOSIS — M25512 Pain in left shoulder: Secondary | ICD-10-CM | POA: Diagnosis not present

## 2016-01-10 DIAGNOSIS — M25512 Pain in left shoulder: Secondary | ICD-10-CM | POA: Diagnosis not present

## 2016-01-16 DIAGNOSIS — M25512 Pain in left shoulder: Secondary | ICD-10-CM | POA: Diagnosis not present

## 2016-01-16 DIAGNOSIS — S42225D 2-part nondisplaced fracture of surgical neck of left humerus, subsequent encounter for fracture with routine healing: Secondary | ICD-10-CM | POA: Diagnosis not present

## 2016-01-17 DIAGNOSIS — M25512 Pain in left shoulder: Secondary | ICD-10-CM | POA: Diagnosis not present

## 2016-01-20 DIAGNOSIS — M25512 Pain in left shoulder: Secondary | ICD-10-CM | POA: Diagnosis not present

## 2016-01-24 DIAGNOSIS — M25512 Pain in left shoulder: Secondary | ICD-10-CM | POA: Diagnosis not present

## 2016-01-26 DIAGNOSIS — M25512 Pain in left shoulder: Secondary | ICD-10-CM | POA: Diagnosis not present

## 2016-02-01 DIAGNOSIS — M25512 Pain in left shoulder: Secondary | ICD-10-CM | POA: Diagnosis not present

## 2016-05-14 DIAGNOSIS — H25813 Combined forms of age-related cataract, bilateral: Secondary | ICD-10-CM | POA: Diagnosis not present

## 2016-07-09 IMAGING — CR DG SHOULDER 2+V*L*
3 series · 3 of 3 positions shown · non-contrast
Comparison: None.

CLINICAL DATA: 64-year-old female with a history of fall. Shoulder
pain

EXAM:
LEFT SHOULDER - 2+ VIEW

[shoulder grashey (1 of 2)]
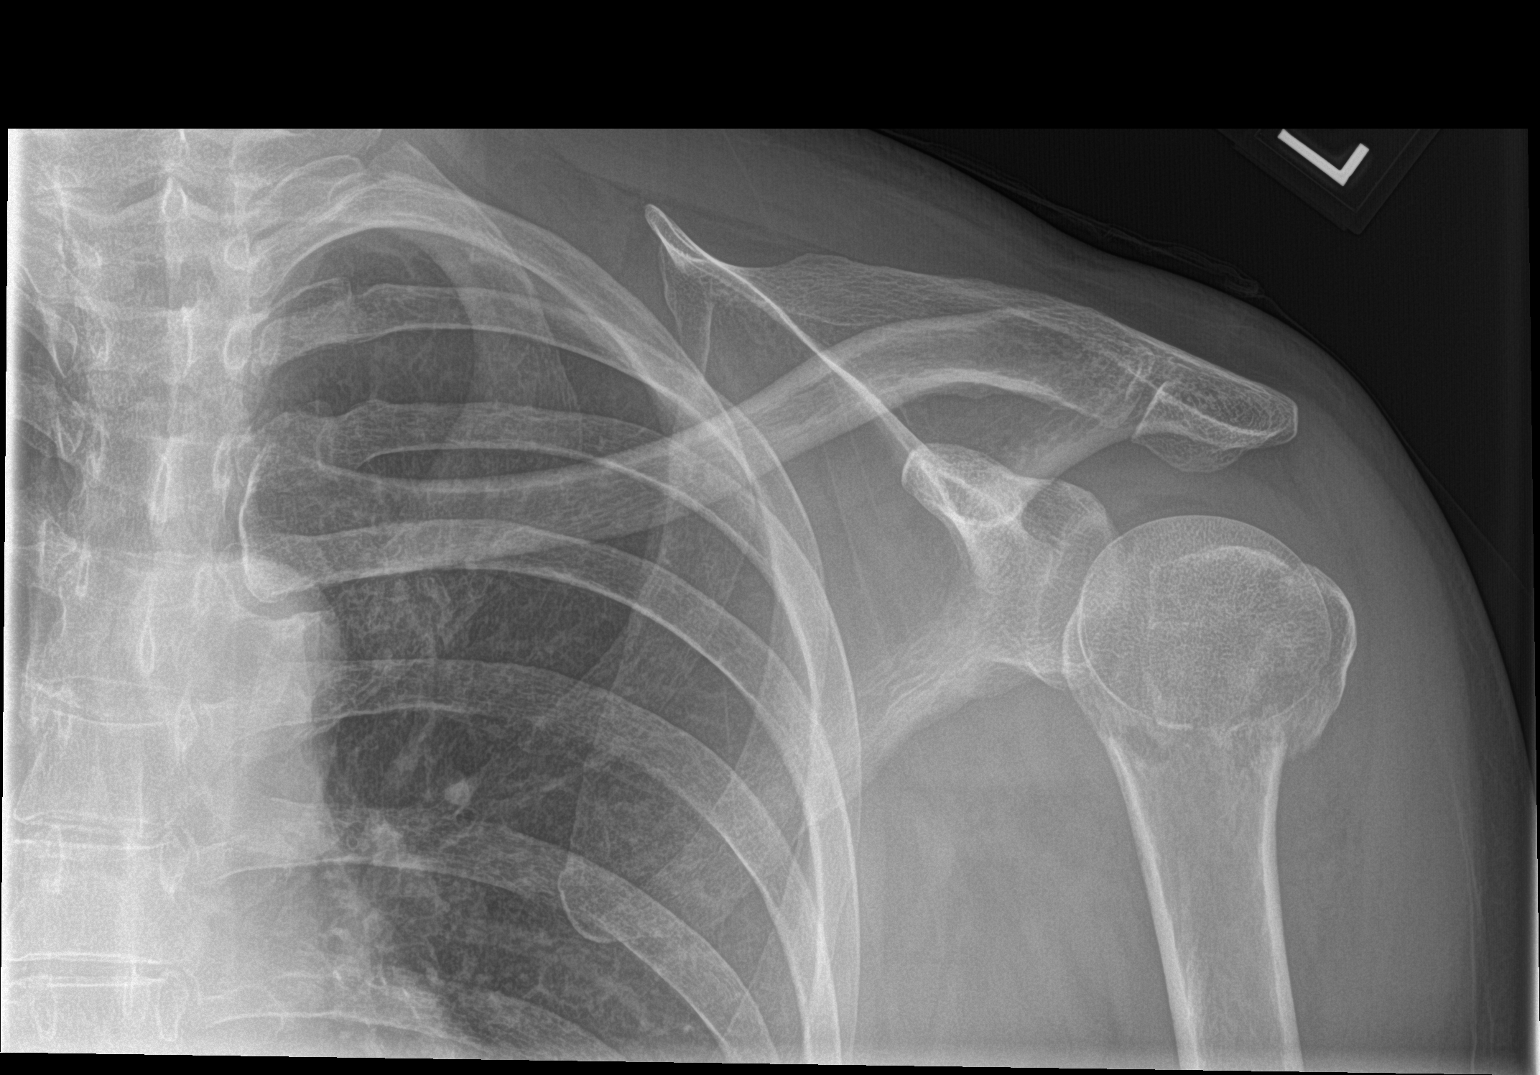

[shoulder y view]
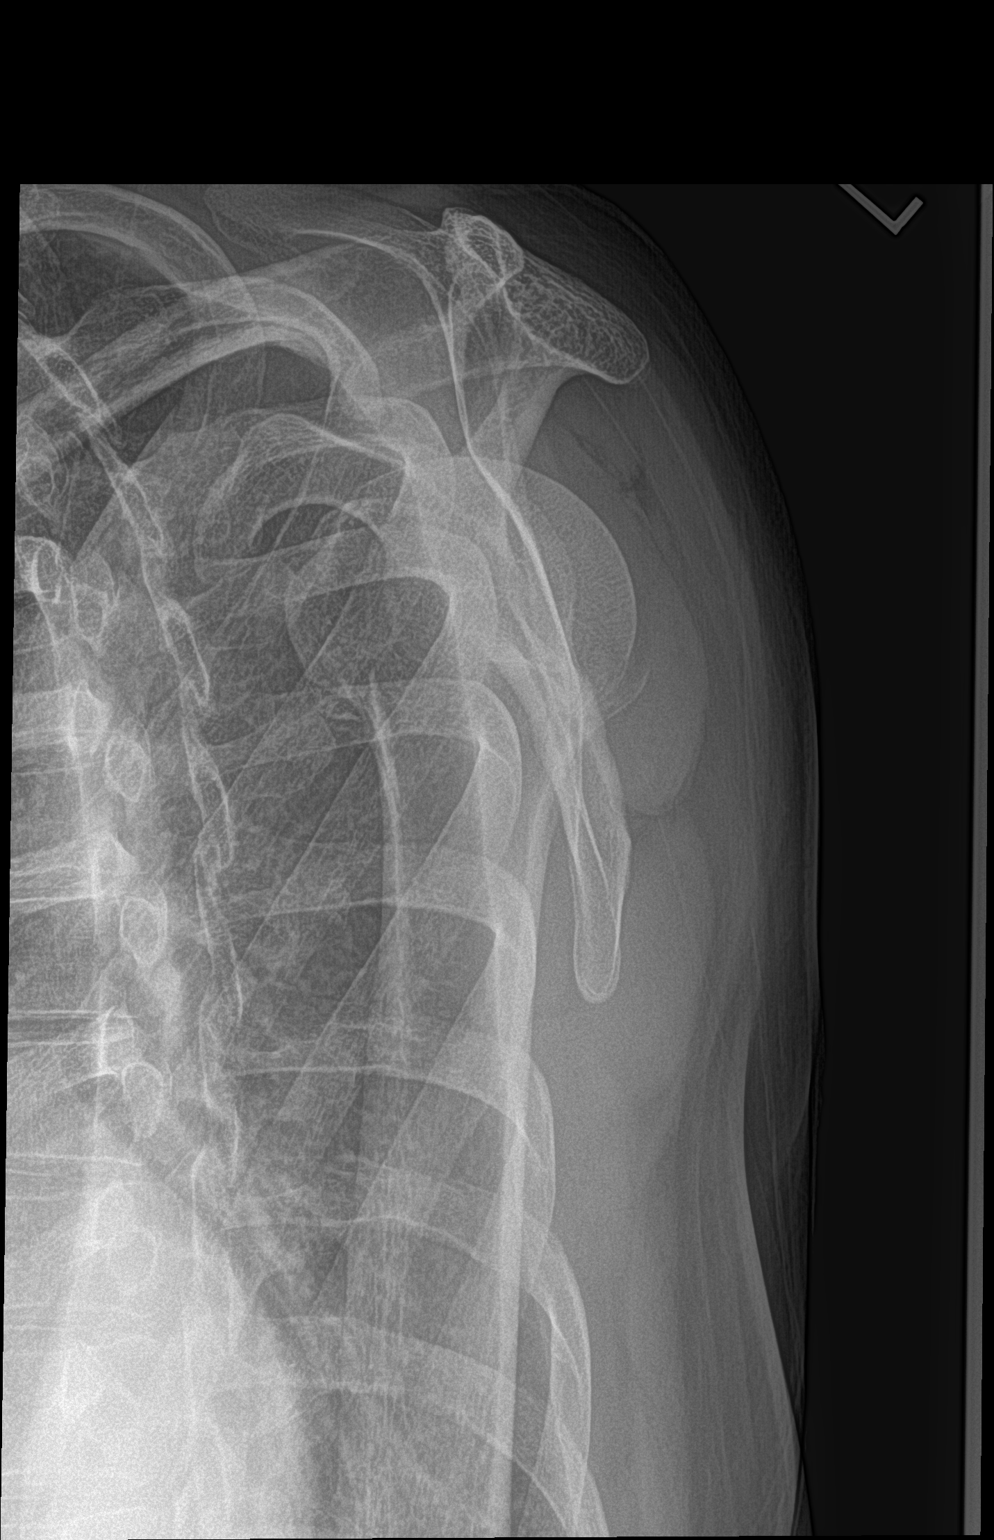

[shoulder grashey (2 of 2)]
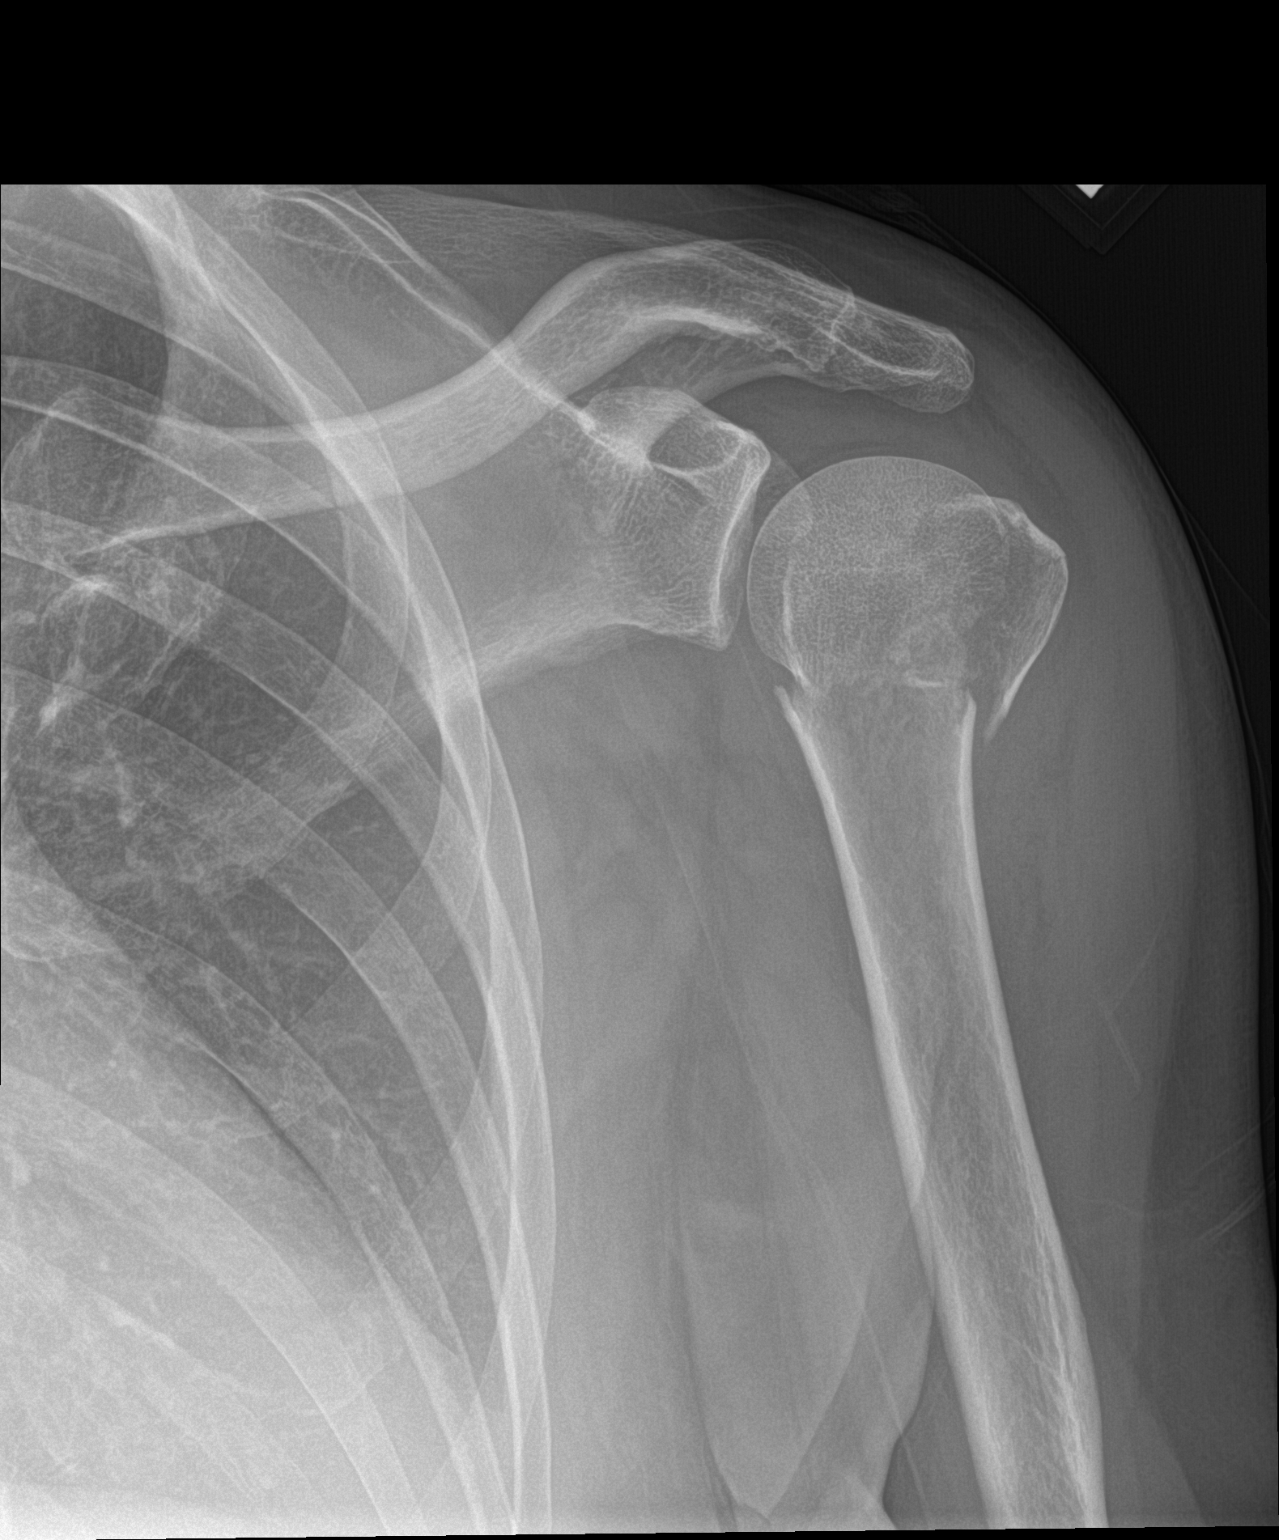

[3 of 3 positions shown; findings below may reference images not displayed]

FINDINGS: Acute fracture of the proximal humerus, surgical neck, with vertical
extension through the tuberosity.
IMPRESSION: Acute fracture of the surgical left humerus, with vertical fracture
fragment through the tuberosity.

## 2016-09-24 DIAGNOSIS — M65311 Trigger thumb, right thumb: Secondary | ICD-10-CM | POA: Diagnosis not present

## 2016-09-24 DIAGNOSIS — M65312 Trigger thumb, left thumb: Secondary | ICD-10-CM | POA: Diagnosis not present

## 2016-09-27 DIAGNOSIS — E042 Nontoxic multinodular goiter: Secondary | ICD-10-CM | POA: Diagnosis not present

## 2016-09-27 DIAGNOSIS — Z0000 Encounter for general adult medical examination without abnormal findings: Secondary | ICD-10-CM | POA: Diagnosis not present

## 2016-09-27 DIAGNOSIS — D693 Immune thrombocytopenic purpura: Secondary | ICD-10-CM | POA: Diagnosis not present

## 2016-09-27 DIAGNOSIS — E7800 Pure hypercholesterolemia, unspecified: Secondary | ICD-10-CM | POA: Diagnosis not present

## 2016-10-03 ENCOUNTER — Encounter: Payer: Self-pay | Admitting: *Deleted

## 2016-10-04 ENCOUNTER — Encounter: Payer: Self-pay | Admitting: Anesthesiology

## 2016-10-10 ENCOUNTER — Encounter
Admission: RE | Disposition: A | Discharge status: Home / Self Care | Payer: Self-pay | Source: Ambulatory Visit | Attending: Surgery

## 2016-10-10 ENCOUNTER — Ambulatory Visit: Payer: PPO | Admitting: Anesthesiology

## 2016-10-10 ENCOUNTER — Ambulatory Visit
Admission: RE | Admit: 2016-10-10 | Discharge: 2016-10-10 | Disposition: A | Discharge status: Home / Self Care | Payer: PPO | Source: Ambulatory Visit | Attending: Surgery | Admitting: Surgery

## 2016-10-10 DIAGNOSIS — Z79899 Other long term (current) drug therapy: Secondary | ICD-10-CM | POA: Diagnosis not present

## 2016-10-10 DIAGNOSIS — M65311 Trigger thumb, right thumb: Secondary | ICD-10-CM | POA: Insufficient documentation

## 2016-10-10 DIAGNOSIS — E039 Hypothyroidism, unspecified: Secondary | ICD-10-CM | POA: Insufficient documentation

## 2016-10-10 DIAGNOSIS — M65312 Trigger thumb, left thumb: Secondary | ICD-10-CM | POA: Diagnosis not present

## 2016-10-10 HISTORY — PX: STERIOD INJECTION: SHX5046

## 2016-10-10 HISTORY — PX: TRIGGER FINGER RELEASE: SHX641

## 2016-10-10 HISTORY — DX: Hypothyroidism, unspecified: E03.9

## 2016-10-10 SURGERY — RELEASE, A1 PULLEY, FOR TRIGGER FINGER
Anesthesia: Regional | Site: Thumb | Laterality: Right | Wound class: Clean

## 2016-10-10 MED ORDER — ACETAMINOPHEN 325 MG PO TABS: 325.0000 mg | ORAL_TABLET | ORAL | Status: DC | PRN

## 2016-10-10 MED ORDER — HYDROCODONE-ACETAMINOPHEN 5-325 MG PO TABS: 1.0000 | ORAL_TABLET | Freq: Four times a day (QID) | ORAL | 0 refills | Status: AC | PRN

## 2016-10-10 MED ORDER — BUPIVACAINE HCL (PF) 0.5 % IJ SOLN
INTRAMUSCULAR | Status: DC | PRN
  Administered 2016-10-10: 5 mL

## 2016-10-10 MED ORDER — LACTATED RINGERS IV SOLN
INTRAVENOUS | Status: DC
  Administered 2016-10-10: 12:00:00 via INTRAVENOUS

## 2016-10-10 MED ORDER — PROPOFOL 500 MG/50ML IV EMUL
INTRAVENOUS | Status: DC | PRN
Start: 2016-10-10 — End: 2016-10-10
  Administered 2016-10-10: 50 ug/kg/min via INTRAVENOUS

## 2016-10-10 MED ORDER — OXYCODONE HCL 5 MG/5ML PO SOLN: 5.0000 mg | Freq: Once | ORAL | Status: DC | PRN

## 2016-10-10 MED ORDER — BUPIVACAINE HCL (PF) 0.5 % IJ SOLN
INTRAMUSCULAR | Status: DC | PRN
  Administered 2016-10-10: .5 mL

## 2016-10-10 MED ORDER — ONDANSETRON HCL 4 MG/2ML IJ SOLN: 4.0000 mg | Freq: Once | INTRAMUSCULAR | Status: DC | PRN

## 2016-10-10 MED ORDER — TRIAMCINOLONE ACETONIDE 40 MG/ML IJ SUSP
INTRAMUSCULAR | Status: DC | PRN
  Administered 2016-10-10: 40 mg via INTRAMUSCULAR

## 2016-10-10 MED ORDER — OXYCODONE HCL 5 MG PO TABS: 5.0000 mg | ORAL_TABLET | Freq: Once | ORAL | Status: DC | PRN

## 2016-10-10 MED ORDER — FENTANYL CITRATE (PF) 100 MCG/2ML IJ SOLN
25.0000 ug | INTRAMUSCULAR | Status: DC | PRN
  Administered 2016-10-10: 100 ug via INTRAVENOUS

## 2016-10-10 MED ORDER — MIDAZOLAM HCL 5 MG/5ML IJ SOLN
INTRAMUSCULAR | Status: DC | PRN
  Administered 2016-10-10: 2 mg via INTRAVENOUS

## 2016-10-10 MED ORDER — ACETAMINOPHEN 160 MG/5ML PO SOLN: 325.0000 mg | ORAL | Status: DC | PRN

## 2016-10-10 MED ORDER — LIDOCAINE HCL (CARDIAC) 20 MG/ML IV SOLN
INTRAVENOUS | Status: DC | PRN
  Administered 2016-10-10: 40 mg via INTRAVENOUS

## 2016-10-10 MED ORDER — CEFAZOLIN SODIUM-DEXTROSE 2-4 GM/100ML-% IV SOLN
2.0000 g | Freq: Once | INTRAVENOUS | Status: AC
  Administered 2016-10-10: 2 g via INTRAVENOUS

## 2016-10-10 SURGICAL SUPPLY — 24 items
BANDAGE ELASTIC 2 LF NS (GAUZE/BANDAGES/DRESSINGS) ×4 IMPLANT
BANDAGE ELASTIC 3 LF NS (GAUZE/BANDAGES/DRESSINGS) IMPLANT
BNDG ESMARK 4X12 TAN STRL LF (GAUZE/BANDAGES/DRESSINGS) ×4 IMPLANT
CHLORAPREP W/TINT 26ML (MISCELLANEOUS) ×4 IMPLANT
CORD BIP STRL DISP 12FT (MISCELLANEOUS) ×4 IMPLANT
COVER LIGHT HANDLE UNIVERSAL (MISCELLANEOUS) ×8 IMPLANT
CUFF TOURNIQUET DUAL PORT 18X3 (MISCELLANEOUS) ×4 IMPLANT
DECANTER SPIKE VIAL GLASS SM (MISCELLANEOUS) ×4 IMPLANT
GAUZE PETRO XEROFOAM 1X8 (MISCELLANEOUS) ×4 IMPLANT
GAUZE SPONGE 4X4 12PLY STRL (GAUZE/BANDAGES/DRESSINGS) ×4 IMPLANT
GLOVE BIO SURGEON STRL SZ8 (GLOVE) ×8 IMPLANT
GLOVE INDICATOR 8.0 STRL GRN (GLOVE) ×4 IMPLANT
GOWN STRL REUS W/ TWL LRG LVL3 (GOWN DISPOSABLE) ×2 IMPLANT
GOWN STRL REUS W/ TWL XL LVL3 (GOWN DISPOSABLE) ×2 IMPLANT
GOWN STRL REUS W/TWL LRG LVL3 (GOWN DISPOSABLE) ×2
GOWN STRL REUS W/TWL XL LVL3 (GOWN DISPOSABLE) ×2
KIT ROOM TURNOVER OR (KITS) ×4 IMPLANT
NS IRRIG 500ML POUR BTL (IV SOLUTION) ×4 IMPLANT
PACK EXTREMITY ARMC (MISCELLANEOUS) ×4 IMPLANT
STOCKINETTE IMPERVIOUS 9X36 MD (GAUZE/BANDAGES/DRESSINGS) ×4 IMPLANT
STRAP BODY AND KNEE 60X3 (MISCELLANEOUS) ×4 IMPLANT
SUT PROLENE 4 0 PS 2 18 (SUTURE) ×4 IMPLANT
SUT VIC AB 3-0 SH 27 (SUTURE)
SUT VIC AB 3-0 SH 27X BRD (SUTURE) IMPLANT

## 2016-10-10 NOTE — Anesthesia Preprocedure Evaluation (Signed)
Anesthesia Evaluation  Patient identified by MRN, date of birth, ID band Patient awake    Reviewed: Allergy & Precautions, H&P , NPO status , Patient's Chart, lab work & pertinent test results  Airway Mallampati: II  TM Distance: >3 FB Neck ROM: full    Dental no notable dental hx.    Pulmonary    Pulmonary exam normal        Cardiovascular Normal cardiovascular exam     Neuro/Psych    GI/Hepatic   Endo/Other  Hypothyroidism   Renal/GU      Musculoskeletal   Abdominal   Peds  Hematology   Anesthesia Other Findings   Reproductive/Obstetrics                             Anesthesia Physical Anesthesia Plan  ASA: II  Anesthesia Plan: Bier Block   Post-op Pain Management:    Induction:   Airway Management Planned:   Additional Equipment:   Intra-op Plan:   Post-operative Plan:   Informed Consent: I have reviewed the patients History and Physical, chart, labs and discussed the procedure including the risks, benefits and alternatives for the proposed anesthesia with the patient or authorized representative who has indicated his/her understanding and acceptance.     Plan Discussed with:   Anesthesia Plan Comments:         Anesthesia Quick Evaluation

## 2016-10-10 NOTE — Transfer of Care (Signed)
Immediate Anesthesia Transfer of Care Note  Patient: Jennifer Williamson  Procedure(s) Performed: Procedure(s): RELEASE TRIGGER FINGER (Left) STEROID INJECTION (Right)  Patient Location: PACU  Anesthesia Type: Bier Block  Level of Consciousness: awake, alert  and patient cooperative  Airway and Oxygen Therapy: Patient Spontanous Breathing and Patient connected to supplemental oxygen  Post-op Assessment: Post-op Vital signs reviewed, Patient's Cardiovascular Status Stable, Respiratory Function Stable, Patent Airway and No signs of Nausea or vomiting  Post-op Vital Signs: Reviewed and stable  Complications: No apparent anesthesia complications

## 2016-10-10 NOTE — Op Note (Signed)
10/10/2016  2:29 PM  Patient:   Jennifer Williamson  Pre-Op Diagnosis:   1. Left trigger thumb.  2. Right trigger thumb.  Post-Op Diagnosis:   Same.  Procedure:   1. Release left trigger thumb.  2. Right trigger thumb injection.  Surgeon:   Pascal Lux, MD  Assistant:   None  Anesthesia:   Bier block  Findings:   As above.  Complications:   None  EBL:   0 cc  Fluids:   850 cc crystalloid  TT:   23 minutes at 250 mmHg  Drains:   None  Closure:   4-0 Prolene  Implants:   None  Brief Clinical Note:   The patient is a 66 year old female with a history of progressively worsening painful catching of both thumbs, left more symptomatic than right. These symptoms have progressed despite medications, activity modification, etc. The patient's history and examination are consistent with bilateral trigger thumbs. The patient presents at this time for release of the left trigger thumb, which is more symptomatic, as well as for a steroid injection of the flexor sheath of the right thumb.  Procedure:   The patient was brought into the operating room and lain in the supine position. After adequate IV sedation was achieved, a timeout was performed to verify the appropriate surgical site. A Bier block was placed by the anesthesiologist and the tourniquet inflated to 250 mmHg. While the Bier block was being placed by the anesthesiologist, the right thumb flexor sheath was injected sterilely using 0.25 cc of Kenalog-40 (10 mg) and 0.5 cc of 0.5% Sensorcaine.   The left hand and upper extremity were prepped with ChloraPrep solution before being draped sterilely. Preoperative antibiotics were administered. An approximately 1.5-2.0 cm incision was made over the volar aspect of the left thumb at the level of the metacarpal head centered over the flexor sheath. The incision was carried down through the subcutaneous tissues with care taken to identify and protect the digital neurovascular structures. The  flexor sheath was entered just proximal to the A1 pulley. The sheath was released proximally for several centimeters under direct visualization. Distally, a clamp was placed beneath the A1 pulley and used to release any adhesions. The clamp was repositioned so that one jaw was superficial to and the other jaw deep to the A1 pulley. The A1 pulley was incised on either side of the clamp to remove a 2 mm strip of tissue. Metzenbaum scissors was used to ensure complete release of the A1 pulley more distally. The underlying tendons were carefully inspected and found to be intact.   The wound was copiously irrigated with sterile saline solution before the wound was closed using 4-0 Prolene interrupted sutures. A total of 10 cc of 0.5% plain Sensorcaine was injected in and around the incision to help with postoperative analgesia before a sterile bulky dressing was applied to the hand. The patient was then awakened and returned to the recovery room in satisfactory condition after tolerating the procedure well.

## 2016-10-10 NOTE — Discharge Instructions (Signed)
General Anesthesia, Adult, Care After These instructions provide you with information about caring for yourself after your procedure. Your health care provider may also give you more specific instructions. Your treatment has been planned according to current medical practices, but problems sometimes occur. Call your health care provider if you have any problems or questions after your procedure. What can I expect after the procedure? After the procedure, it is common to have:  Vomiting.  A sore throat.  Mental slowness. It is common to feel:  Nauseous.  Cold or shivery.  Sleepy.  Tired.  Sore or achy, even in parts of your body where you did not have surgery. Follow these instructions at home: For at least 24 hours after the procedure:   Do not:  Participate in activities where you could fall or become injured.  Drive.  Use heavy machinery.  Drink alcohol.  Take sleeping pills or medicines that cause drowsiness.  Make important decisions or sign legal documents.  Take care of children on your own.  Rest. Eating and drinking   If you vomit, drink water, juice, or soup when you can drink without vomiting.  Drink enough fluid to keep your urine clear or pale yellow.  Make sure you have little or no nausea before eating solid foods.  Follow the diet recommended by your health care provider. General instructions   Have a responsible adult stay with you until you are awake and alert.  Return to your normal activities as told by your health care provider. Ask your health care provider what activities are safe for you.  Take over-the-counter and prescription medicines only as told by your health care provider.  If you smoke, do not smoke without supervision.  Keep all follow-up visits as told by your health care provider. This is important. Contact a health care provider if:  You continue to have nausea or vomiting at home, and medicines are not helpful.  You  cannot drink fluids or start eating again.  You cannot urinate after 8-12 hours.  You develop a skin rash.  You have fever.  You have increasing redness at the site of your procedure. Get help right away if:  You have difficulty breathing.  You have chest pain.  You have unexpected bleeding.  You feel that you are having a life-threatening or urgent problem. This information is not intended to replace advice given to you by your health care provider. Make sure you discuss any questions you have with your health care provider. Document Released: 10/22/2000 Document Revised: 12/19/2015 Document Reviewed: 06/30/2015 Elsevier Interactive Patient Education  2017 [place] dressing dry and intact. Keep hand elevated above heart level. May shower after dressing removed on postop day 4 (Sunday). Cover sutures with Band-Aids after drying off. Apply ice to affected area frequently. Take ibuprofen 600 mg TID with meals for 7-10 days, then as necessary. Take ES Tylenol or pain medication as prescribed when needed.  Return for follow-up in 10-14 days or as scheduled.

## 2016-10-10 NOTE — H&P (Signed)
Paper H&P to be scanned into permanent record. H&P reviewed and patient re-examined. No changes. 

## 2016-10-10 NOTE — Anesthesia Postprocedure Evaluation (Signed)
Anesthesia Post Note  Patient: Jennifer Williamson  Procedure(s) Performed: Procedure(s) (LRB): RELEASE TRIGGER FINGER (Left) STEROID INJECTION (Right)  Patient location during evaluation: PACU Anesthesia Type: Bier Block Level of consciousness: awake and alert and oriented Pain management: satisfactory to patient Vital Signs Assessment: post-procedure vital signs reviewed and stable Respiratory status: spontaneous breathing, nonlabored ventilation and respiratory function stable Cardiovascular status: blood pressure returned to baseline and stable Postop Assessment: Adequate PO intake and No signs of nausea or vomiting Anesthetic complications: no    Raliegh Ip

## 2016-10-10 NOTE — Anesthesia Procedure Notes (Addendum)
Anesthesia Regional Block: Bier block (IV Regional)   Pre-Anesthetic Checklist: ,, timeout performed, Correct Patient, Correct Site, Correct Laterality, Correct Procedure, Correct Position, site marked, Risks and benefits discussed, Surgical consent,  Pre-op evaluation,  At surgeon's request  Laterality: Left      Procedures:,,,,,,, Esmarch exsanguination, #20gu IV placed and double tourniquet utilized  Narrative:  Start time: 10/10/2016 2:02 PM End time: 10/10/2016 2:04 PM  Performed by: With CRNAs  Anesthesiologist: Ronelle Nigh CRNA: Mayme Genta  Additional Notes: Double tourniquet to L upper arm. Exsanguination of L arm with esmarch. Distal and proximal cuffs inflated. Distal cuff then deflated. Negative radial pulse noted. 43mL 0.5% Lidocaine injected by Dr Junious Dresser without difficulty. @22g  angio d/c'd after injection.

## 2016-10-10 NOTE — Anesthesia Procedure Notes (Signed)
Procedure Name: MAC Performed by: Dayson Aboud Pre-anesthesia Checklist: Patient identified, Emergency Drugs available, Suction available, Patient being monitored and Timeout performed Patient Re-evaluated:Patient Re-evaluated prior to inductionOxygen Delivery Method: Simple face mask Placement Confirmation: positive ETCO2 and breath sounds checked- equal and bilateral       

## 2016-10-11 ENCOUNTER — Encounter: Payer: Self-pay | Admitting: Surgery

## 2017-04-19 DIAGNOSIS — Z86010 Personal history of colonic polyps: Secondary | ICD-10-CM | POA: Diagnosis not present

## 2017-06-19 DIAGNOSIS — M65311 Trigger thumb, right thumb: Secondary | ICD-10-CM | POA: Diagnosis not present

## 2017-06-19 DIAGNOSIS — R21 Rash and other nonspecific skin eruption: Secondary | ICD-10-CM | POA: Diagnosis not present

## 2017-06-19 DIAGNOSIS — Z8619 Personal history of other infectious and parasitic diseases: Secondary | ICD-10-CM

## 2017-06-19 HISTORY — DX: Personal history of other infectious and parasitic diseases: Z86.19

## 2017-07-17 ENCOUNTER — Ambulatory Visit: Payer: PPO | Admitting: Anesthesiology

## 2017-07-17 ENCOUNTER — Ambulatory Visit
Admission: RE | Admit: 2017-07-17 | Discharge: 2017-07-17 | Disposition: A | Discharge status: Home / Self Care | Payer: PPO | Source: Ambulatory Visit | Attending: Unknown Physician Specialty | Admitting: Unknown Physician Specialty

## 2017-07-17 ENCOUNTER — Encounter
Admission: RE | Disposition: A | Discharge status: Home / Self Care | Payer: Self-pay | Source: Ambulatory Visit | Attending: Unknown Physician Specialty

## 2017-07-17 DIAGNOSIS — Z1211 Encounter for screening for malignant neoplasm of colon: Secondary | ICD-10-CM | POA: Diagnosis not present

## 2017-07-17 DIAGNOSIS — M8580 Other specified disorders of bone density and structure, unspecified site: Secondary | ICD-10-CM | POA: Diagnosis not present

## 2017-07-17 DIAGNOSIS — E039 Hypothyroidism, unspecified: Secondary | ICD-10-CM | POA: Diagnosis not present

## 2017-07-17 DIAGNOSIS — Z79899 Other long term (current) drug therapy: Secondary | ICD-10-CM | POA: Diagnosis not present

## 2017-07-17 DIAGNOSIS — D125 Benign neoplasm of sigmoid colon: Secondary | ICD-10-CM | POA: Diagnosis not present

## 2017-07-17 DIAGNOSIS — Z86010 Personal history of colonic polyps: Secondary | ICD-10-CM | POA: Insufficient documentation

## 2017-07-17 DIAGNOSIS — D122 Benign neoplasm of ascending colon: Secondary | ICD-10-CM | POA: Insufficient documentation

## 2017-07-17 DIAGNOSIS — K5790 Diverticulosis of intestine, part unspecified, without perforation or abscess without bleeding: Secondary | ICD-10-CM | POA: Diagnosis not present

## 2017-07-17 DIAGNOSIS — D124 Benign neoplasm of descending colon: Secondary | ICD-10-CM | POA: Diagnosis not present

## 2017-07-17 DIAGNOSIS — E7800 Pure hypercholesterolemia, unspecified: Secondary | ICD-10-CM | POA: Insufficient documentation

## 2017-07-17 DIAGNOSIS — K640 First degree hemorrhoids: Secondary | ICD-10-CM | POA: Insufficient documentation

## 2017-07-17 DIAGNOSIS — K635 Polyp of colon: Secondary | ICD-10-CM | POA: Insufficient documentation

## 2017-07-17 DIAGNOSIS — K5730 Diverticulosis of large intestine without perforation or abscess without bleeding: Secondary | ICD-10-CM | POA: Insufficient documentation

## 2017-07-17 HISTORY — DX: Immune thrombocytopenic purpura: D69.3

## 2017-07-17 HISTORY — DX: Other specified disorders of bone density and structure, unspecified site: M85.80

## 2017-07-17 HISTORY — DX: Pure hypercholesterolemia, unspecified: E78.00

## 2017-07-17 HISTORY — PX: COLONOSCOPY WITH PROPOFOL: SHX5780

## 2017-07-17 HISTORY — DX: Polyp of colon: K63.5

## 2017-07-17 SURGERY — COLONOSCOPY WITH PROPOFOL
Anesthesia: General

## 2017-07-17 MED ORDER — FENTANYL CITRATE (PF) 100 MCG/2ML IJ SOLN
INTRAMUSCULAR | Status: AC
  Filled 2017-07-17: qty 2

## 2017-07-17 MED ORDER — SODIUM CHLORIDE 0.9 % IV SOLN: INTRAVENOUS | Status: DC

## 2017-07-17 MED ORDER — PROPOFOL 500 MG/50ML IV EMUL
INTRAVENOUS | Status: DC | PRN
  Administered 2017-07-17: 50 ug/kg/min via INTRAVENOUS

## 2017-07-17 MED ORDER — MIDAZOLAM HCL 2 MG/2ML IJ SOLN
INTRAMUSCULAR | Status: AC
  Filled 2017-07-17: qty 2

## 2017-07-17 MED ORDER — EPHEDRINE SULFATE 50 MG/ML IJ SOLN
INTRAMUSCULAR | Status: DC | PRN
Start: 2017-07-17 — End: 2017-07-17
  Administered 2017-07-17 (×2): 10 mg via INTRAVENOUS

## 2017-07-17 MED ORDER — MIDAZOLAM HCL 5 MG/5ML IJ SOLN
INTRAMUSCULAR | Status: DC | PRN
  Administered 2017-07-17: 2 mg via INTRAVENOUS

## 2017-07-17 MED ORDER — PROPOFOL 10 MG/ML IV BOLUS
INTRAVENOUS | Status: DC | PRN
  Administered 2017-07-17 (×2): 10 mg via INTRAVENOUS
  Administered 2017-07-17: 30 mg via INTRAVENOUS

## 2017-07-17 MED ORDER — PROPOFOL 500 MG/50ML IV EMUL
INTRAVENOUS | Status: AC
  Filled 2017-07-17: qty 50

## 2017-07-17 MED ORDER — LIDOCAINE HCL (PF) 1 % IJ SOLN
2.0000 mL | Freq: Once | INTRAMUSCULAR | Status: AC
  Administered 2017-07-17: 0.3 mL via INTRADERMAL

## 2017-07-17 MED ORDER — FENTANYL CITRATE (PF) 100 MCG/2ML IJ SOLN
INTRAMUSCULAR | Status: DC | PRN
  Administered 2017-07-17 (×2): 50 ug via INTRAVENOUS

## 2017-07-17 MED ORDER — SODIUM CHLORIDE 0.9 % IV SOLN
INTRAVENOUS | Status: DC
  Administered 2017-07-17: 1000 mL via INTRAVENOUS

## 2017-07-17 MED ORDER — LIDOCAINE HCL (PF) 1 % IJ SOLN
INTRAMUSCULAR | Status: AC
  Administered 2017-07-17: 0.3 mL via INTRADERMAL
  Filled 2017-07-17: qty 2

## 2017-07-17 MED ORDER — LIDOCAINE HCL (PF) 2 % IJ SOLN
INTRAMUSCULAR | Status: DC | PRN
  Administered 2017-07-17: 60 mg

## 2017-07-17 MED ORDER — LIDOCAINE HCL (PF) 2 % IJ SOLN
INTRAMUSCULAR | Status: AC
  Filled 2017-07-17: qty 10

## 2017-07-17 NOTE — Transfer of Care (Signed)
Immediate Anesthesia Transfer of Care Note  Patient: Jennifer Williamson  Procedure(s) Performed: COLONOSCOPY WITH PROPOFOL (N/A )  Patient Location: PACU  Anesthesia Type:General  Level of Consciousness: sedated  Airway & Oxygen Therapy: Patient Spontanous Breathing and Patient connected to nasal cannula oxygen  Post-op Assessment: Report given to RN and Post -op Vital signs reviewed and stable  Post vital signs: Reviewed and stable  Last Vitals:  Vitals:   07/17/17 0743 07/17/17 0933  BP: (!) 152/85 (P) 115/66  Pulse: 74 (P) 74  Resp: 17 (P) 14  Temp: (!) 36.2 C (!) (P) 36.3 C  SpO2: 100% (P) 100%    Last Pain: There were no vitals filed for this visit.       Complications: No apparent anesthesia complications

## 2017-07-17 NOTE — Op Note (Signed)
[facility] Gastroenterology Patient Name: Jennifer Williamson Procedure Date: 07/17/2017 8:38 AM MRN: [id number] Account #: [id number] Date of Birth: 1951/02/08 Admit Type: Outpatient Age: 66 Room: [facility] ENDO ROOM 3 Gender: Female Note Status: Finalized Procedure:            Colonoscopy Indications:          High risk colon cancer surveillance: Personal history                        of colonic polyps Providers:            Manya Silvas, MD Medicines:            Propofol per Anesthesia Complications:        No immediate complications. Procedure:            Pre-Anesthesia Assessment:                       - After reviewing the risks and benefits, the patient                        was deemed in satisfactory condition to undergo the                        procedure.                       After obtaining informed consent, the colonoscope was                        passed under direct vision. Throughout the procedure,                        the patient's blood pressure, pulse, and oxygen                        saturations were monitored continuously. The                        Colonoscope was introduced through the anus and                        advanced to the the cecum, identified by appendiceal                        orifice and ileocecal valve. The colonoscopy was                        somewhat difficult due to restricted mobility of the                        colon. Successful completion of the procedure was aided                        by applying abdominal pressure. The patient tolerated                        the procedure well. The quality of the bowel                        preparation was good. Findings:      A  diminutive polyp was found in the descending colon. The polyp was       sessile. The polyp was removed with a cold snare. Resection and       retrieval were complete.      A diminutive polyp was found in the ascending colon. The polyp was   sessile. The polyp was removed with a cold snare. Resection and       retrieval were complete.      A small polyp was found in the sigmoid colon. The polyp was sessile. The       polyp was removed with a hot snare. Resection and retrieval were       complete.      Multiple small and large-mouthed diverticula were found in the sigmoid       colon and descending colon.      Internal hemorrhoids were found during endoscopy. The hemorrhoids were       small and Grade I (internal hemorrhoids that do not prolapse). Impression:           - One diminutive polyp in the descending colon, removed                        with a cold snare. Resected and retrieved.                       - One diminutive polyp in the ascending colon, removed                        with a cold snare. Resected and retrieved.                       - One small polyp in the sigmoid colon, removed with a                        hot snare. Resected and retrieved.                       - Diverticulosis in the sigmoid colon and in the                        descending colon.                       - Internal hemorrhoids. Recommendation:       - Await pathology results. Manya Silvas, MD 07/17/2017 9:31:17 AM This report has been signed electronically. Number of Addenda: 0 Note Initiated On: 07/17/2017 8:38 AM Scope Withdrawal Time: 0 hours 10 minutes 25 seconds  Total Procedure Duration: 0 hours 35 minutes 7 seconds       [facility]

## 2017-07-17 NOTE — Anesthesia Preprocedure Evaluation (Signed)
Anesthesia Evaluation  Patient identified by MRN, date of birth, ID band Patient awake    Reviewed: Allergy & Precautions, NPO status , Patient's Chart, lab work & pertinent test results  Airway Mallampati: II       Dental  (+) Teeth Intact   Pulmonary neg pulmonary ROS,    breath sounds clear to auscultation       Cardiovascular Exercise Tolerance: Good  Rhythm:Regular Rate:Normal     Neuro/Psych negative neurological ROS  negative psych ROS   GI/Hepatic negative GI ROS, Neg liver ROS,   Endo/Other  Hypothyroidism   Renal/GU negative Renal ROS     Musculoskeletal negative musculoskeletal ROS (+)   Abdominal Normal abdominal exam  (+)   Peds negative pediatric ROS (+)  Hematology negative hematology ROS (+)   Anesthesia Other Findings   Reproductive/Obstetrics                             Anesthesia Physical Anesthesia Plan  ASA: II  Anesthesia Plan: General   Post-op Pain Management:    Induction: Intravenous  PONV Risk Score and Plan: Ondansetron  Airway Management Planned: Natural Airway and Nasal Cannula  Additional Equipment:   Intra-op Plan:   Post-operative Plan:   Informed Consent: I have reviewed the patients History and Physical, chart, labs and discussed the procedure including the risks, benefits and alternatives for the proposed anesthesia with the patient or authorized representative who has indicated his/her understanding and acceptance.     Plan Discussed with: CRNA  Anesthesia Plan Comments:         Anesthesia Quick Evaluation

## 2017-07-17 NOTE — H&P (Signed)
Primary Care Physician:  Kirk Ruths, MD Primary Gastroenterologist:  Dr. Vira Agar  Pre-Procedure History & Physical: HPI:  Jennifer Williamson is a 66 y.o. female is here for an colonoscopy.   Past Medical History:  Diagnosis Date  . Colon polyps   . High cholesterol   . Hypothyroidism   . Idiopathic thrombocytopenia ([place])   . Osteopenia     Past Surgical History:  Procedure Laterality Date  . APPENDECTOMY    . CHOLECYSTECTOMY    . COLONOSCOPY    . LIVER SURGERY  2000  . OPEN REDUCTION INTERNAL FIXATION (ORIF) DISTAL RADIAL FRACTURE Right 09/13/2015   Procedure: OPEN REDUCTION INTERNAL FIXATION (ORIF) DISTAL RADIAL FRACTURE;  Surgeon: Corky Mull, MD;  Location: [facility];  Service: Orthopedics;  Laterality: Right;  . STERIOD INJECTION Right 10/10/2016   Procedure: STEROID INJECTION;  Surgeon: Corky Mull, MD;  Location: [place];  Service: Orthopedics;  Laterality: Right;  . TRIGGER FINGER RELEASE Left 10/10/2016   Procedure: RELEASE TRIGGER FINGER;  Surgeon: Corky Mull, MD;  Location: [place];  Service: Orthopedics;  Laterality: Left;    Prior to Admission medications   Medication Sig Start Date End Date Taking? Authorizing Provider  ibuprofen (ADVIL,MOTRIN) 200 MG tablet Take 400 mg by mouth every 6 (six) hours as needed.   Yes [provider]  Cholecalciferol (VITAMIN D3) 2000 units TABS Take by mouth daily.    [provider]  HYDROcodone-acetaminophen (NORCO) 5-325 MG tablet Take 1-2 tablets by mouth every 6 (six) hours as needed for moderate pain. MAXIMUM TOTAL ACETAMINOPHEN DOSE IS 4000 MG PER DAY Patient not taking: Reported on 07/16/2017 10/10/16   Poggi, Marshall Cork, MD  levothyroxine (SYNTHROID, LEVOTHROID) 88 MCG tablet Take 88 mcg by mouth daily before breakfast.    [provider]    Allergies as of 04/30/2017  . (No Known Allergies)    Family History  Problem Relation Age of Onset  . Breast cancer Neg Hx      Social History   Socioeconomic History  . Marital status: Married    Spouse name: Not on file  . Number of children: Not on file  . Years of education: Not on file  . Highest education level: Not on file  Social Needs  . Financial resource strain: Not on file  . Food insecurity - worry: Not on file  . Food insecurity - inability: Not on file  . Transportation needs - medical: Not on file  . Transportation needs - non-medical: Not on file  Occupational History  . Not on file  Tobacco Use  . Smoking status: Never Smoker  . Smokeless tobacco: Never Used  Substance and Sexual Activity  . Alcohol use: Yes    Alcohol/week: 3.6 oz    Types: 6 Glasses of wine per week  . Drug use: Not on file  . Sexual activity: Not on file  Other Topics Concern  . Not on file  Social History Narrative  . Not on file    Review of Systems: See HPI, otherwise negative ROS  Physical Exam: BP (!) 152/85   Pulse 74   Temp (!) 97.1 F (36.2 C)   Resp 17   Ht 5\' 6"  (1.676 m)   Wt 70.3 kg (155 lb)   SpO2 100%   BMI 25.02 kg/m  General:   Alert,  pleasant and cooperative in NAD Head:  Normocephalic and atraumatic. Neck:  Supple; no masses or thyromegaly. Lungs:  Clear throughout to auscultation.    Heart:  Regular rate and rhythm. Abdomen:  Soft, nontender and nondistended. Normal bowel sounds, without guarding, and without rebound.   Neurologic:  Alert and  oriented x4;  grossly normal neurologically.  Impression/Plan: Jennifer Williamson is here for an colonoscopy to be performed for [facility] colon polyps.  Risks, benefits, limitations, and alternatives regarding  colonoscopy have been reviewed with the patient.  Questions have been answered.  All parties agreeable.   Gaylyn Cheers, MD  07/17/2017, 8:47 AM

## 2017-07-17 NOTE — Anesthesia Post-op Follow-up Note (Signed)
Anesthesia QCDR form completed.        

## 2017-07-17 NOTE — Anesthesia Postprocedure Evaluation (Signed)
Anesthesia Post Note  Patient: Jennifer Williamson  Procedure(s) Performed: COLONOSCOPY WITH PROPOFOL (N/A )  Patient location during evaluation: PACU Anesthesia Type: General Level of consciousness: awake Pain management: pain level controlled Vital Signs Assessment: post-procedure vital signs reviewed and stable Respiratory status: spontaneous breathing Cardiovascular status: stable Anesthetic complications: no     Last Vitals:  Vitals:   07/17/17 1000 07/17/17 1013  BP: 120/73 136/72  Pulse: 71 67  Resp: 16 14  Temp:    SpO2: 100% 99%    Last Pain:  Vitals:   07/17/17 1013  PainSc: 0-No pain                 VAN STAVEREN,Laury Huizar

## 2017-07-18 ENCOUNTER — Other Ambulatory Visit: Payer: Self-pay

## 2017-07-18 ENCOUNTER — Encounter: Payer: Self-pay | Admitting: *Deleted

## 2017-07-19 LAB — SURGICAL PATHOLOGY

## 2017-07-31 ENCOUNTER — Ambulatory Visit
Admission: RE | Admit: 2017-07-31 | Discharge: 2017-07-31 | Disposition: A | Discharge status: Home / Self Care | Payer: PPO | Source: Ambulatory Visit | Attending: Surgery | Admitting: Surgery

## 2017-07-31 ENCOUNTER — Encounter
Admission: RE | Disposition: A | Discharge status: Home / Self Care | Payer: Self-pay | Source: Ambulatory Visit | Attending: Surgery

## 2017-07-31 ENCOUNTER — Ambulatory Visit: Payer: PPO | Admitting: Student in an Organized Health Care Education/Training Program

## 2017-07-31 DIAGNOSIS — M65311 Trigger thumb, right thumb: Secondary | ICD-10-CM | POA: Diagnosis not present

## 2017-07-31 DIAGNOSIS — E039 Hypothyroidism, unspecified: Secondary | ICD-10-CM | POA: Diagnosis not present

## 2017-07-31 HISTORY — DX: Personal history of other infectious and parasitic diseases: Z86.19

## 2017-07-31 HISTORY — PX: TRIGGER FINGER RELEASE: SHX641

## 2017-07-31 SURGERY — RELEASE, A1 PULLEY, FOR TRIGGER FINGER
Anesthesia: Regional | Site: Hand | Laterality: Right | Wound class: Clean

## 2017-07-31 MED ORDER — POTASSIUM CHLORIDE IN NACL 20-0.9 MEQ/L-% IV SOLN: INTRAVENOUS | Status: DC

## 2017-07-31 MED ORDER — ONDANSETRON HCL 4 MG PO TABS: 4.0000 mg | ORAL_TABLET | Freq: Four times a day (QID) | ORAL | Status: DC | PRN

## 2017-07-31 MED ORDER — METOCLOPRAMIDE HCL 5 MG PO TABS: 5.0000 mg | ORAL_TABLET | Freq: Three times a day (TID) | ORAL | Status: DC | PRN

## 2017-07-31 MED ORDER — OXYCODONE HCL 5 MG/5ML PO SOLN: 5.0000 mg | Freq: Once | ORAL | Status: DC | PRN

## 2017-07-31 MED ORDER — MIDAZOLAM HCL 5 MG/5ML IJ SOLN
INTRAMUSCULAR | Status: DC | PRN
  Administered 2017-07-31 (×2): 1 mg via INTRAVENOUS

## 2017-07-31 MED ORDER — BUPIVACAINE HCL (PF) 0.5 % IJ SOLN
INTRAMUSCULAR | Status: DC | PRN
  Administered 2017-07-31: 8 mL

## 2017-07-31 MED ORDER — METOCLOPRAMIDE HCL 5 MG/ML IJ SOLN: 5.0000 mg | Freq: Three times a day (TID) | INTRAMUSCULAR | Status: DC | PRN

## 2017-07-31 MED ORDER — FENTANYL CITRATE (PF) 100 MCG/2ML IJ SOLN: 25.0000 ug | INTRAMUSCULAR | Status: DC | PRN

## 2017-07-31 MED ORDER — CEFAZOLIN SODIUM-DEXTROSE 2-4 GM/100ML-% IV SOLN
2.0000 g | Freq: Once | INTRAVENOUS | Status: AC
  Administered 2017-07-31: 2 g via INTRAVENOUS

## 2017-07-31 MED ORDER — LACTATED RINGERS IV SOLN
INTRAVENOUS | Status: DC
  Administered 2017-07-31: 12:00:00 via INTRAVENOUS

## 2017-07-31 MED ORDER — LACTATED RINGERS IV SOLN: INTRAVENOUS | Status: DC

## 2017-07-31 MED ORDER — ONDANSETRON HCL 4 MG/2ML IJ SOLN: 4.0000 mg | Freq: Once | INTRAMUSCULAR | Status: DC | PRN

## 2017-07-31 MED ORDER — LIDOCAINE HCL (PF) 0.5 % IJ SOLN
INTRAMUSCULAR | Status: DC | PRN
  Administered 2017-07-31: 40 mL via INTRAVENOUS

## 2017-07-31 MED ORDER — PROPOFOL 500 MG/50ML IV EMUL
INTRAVENOUS | Status: DC | PRN
  Administered 2017-07-31: 120 ug/kg/min via INTRAVENOUS

## 2017-07-31 MED ORDER — FENTANYL CITRATE (PF) 100 MCG/2ML IJ SOLN
INTRAMUSCULAR | Status: DC | PRN
  Administered 2017-07-31: 50 ug via INTRAVENOUS

## 2017-07-31 MED ORDER — OXYCODONE HCL 5 MG PO TABS: 5.0000 mg | ORAL_TABLET | Freq: Once | ORAL | Status: DC | PRN

## 2017-07-31 MED ORDER — ONDANSETRON HCL 4 MG/2ML IJ SOLN: 4.0000 mg | Freq: Four times a day (QID) | INTRAMUSCULAR | Status: DC | PRN

## 2017-07-31 MED ORDER — ONDANSETRON HCL 4 MG/2ML IJ SOLN
INTRAMUSCULAR | Status: DC | PRN
  Administered 2017-07-31: 4 mg via INTRAVENOUS

## 2017-07-31 MED ORDER — HYDROCODONE-ACETAMINOPHEN 5-325 MG PO TABS: 1.0000 | ORAL_TABLET | ORAL | Status: DC | PRN

## 2017-07-31 MED ORDER — ACETAMINOPHEN 10 MG/ML IV SOLN: 1000.0000 mg | Freq: Once | INTRAVENOUS | Status: DC | PRN

## 2017-07-31 SURGICAL SUPPLY — 24 items
BANDAGE ELASTIC 2 LF NS (GAUZE/BANDAGES/DRESSINGS) ×3 IMPLANT
BANDAGE ELASTIC 3 LF NS (GAUZE/BANDAGES/DRESSINGS) IMPLANT
BNDG ESMARK 4X12 TAN STRL LF (GAUZE/BANDAGES/DRESSINGS) ×3 IMPLANT
CHLORAPREP W/TINT 26ML (MISCELLANEOUS) ×3 IMPLANT
CORD BIP STRL DISP 12FT (MISCELLANEOUS) ×3 IMPLANT
COVER LIGHT HANDLE UNIVERSAL (MISCELLANEOUS) ×6 IMPLANT
CUFF TOURNIQUET DUAL PORT 18X3 (MISCELLANEOUS) ×3 IMPLANT
DECANTER SPIKE VIAL GLASS SM (MISCELLANEOUS) IMPLANT
GAUZE PETRO XEROFOAM 1X8 (MISCELLANEOUS) ×3 IMPLANT
GAUZE SPONGE 4X4 12PLY STRL (GAUZE/BANDAGES/DRESSINGS) ×3 IMPLANT
GLOVE BIO SURGEON STRL SZ8 (GLOVE) ×6 IMPLANT
GLOVE INDICATOR 8.0 STRL GRN (GLOVE) ×6 IMPLANT
GOWN STRL REUS W/ TWL LRG LVL3 (GOWN DISPOSABLE) ×1 IMPLANT
GOWN STRL REUS W/ TWL XL LVL3 (GOWN DISPOSABLE) ×1 IMPLANT
GOWN STRL REUS W/TWL LRG LVL3 (GOWN DISPOSABLE) ×2
GOWN STRL REUS W/TWL XL LVL3 (GOWN DISPOSABLE) ×2
KIT ROOM TURNOVER OR (KITS) ×3 IMPLANT
NS IRRIG 500ML POUR BTL (IV SOLUTION) ×3 IMPLANT
PACK EXTREMITY ARMC (MISCELLANEOUS) ×3 IMPLANT
STOCKINETTE IMPERVIOUS 9X36 MD (GAUZE/BANDAGES/DRESSINGS) ×3 IMPLANT
STRAP BODY AND KNEE 60X3 (MISCELLANEOUS) ×3 IMPLANT
SUT PROLENE 4 0 PS 2 18 (SUTURE) ×3 IMPLANT
SUT VIC AB 3-0 SH 27 (SUTURE)
SUT VIC AB 3-0 SH 27X BRD (SUTURE) IMPLANT

## 2017-07-31 NOTE — Anesthesia Procedure Notes (Signed)
Date/Time: 07/31/2017 12:06 PM Performed by: Cameron Ali, CRNA Pre-anesthesia Checklist: Patient identified, Emergency Drugs available, Suction available, Patient being monitored and Timeout performed Patient Re-evaluated:Patient Re-evaluated prior to induction Oxygen Delivery Method: Simple face mask Placement Confirmation: positive ETCO2 and breath sounds checked- equal and bilateral

## 2017-07-31 NOTE — Anesthesia Procedure Notes (Signed)
Anesthesia Regional Block: Bier block (IV Regional)   Pre-Anesthetic Checklist: ,, timeout performed, Correct Patient, Correct Site, Correct Laterality, Correct Procedure, Correct Position, site marked, Risks and benefits discussed,  Surgical consent,  Pre-op evaluation,  At surgeon's request and post-op pain management  Laterality: Right  Prep: alcohol swabs        Procedures:,,,,, intact distal pulses, Esmarch exsanguination,, #20gu IV placed and double tourniquet utilized  Narrative:  Start time: 07/31/2017 12:10 PM End time: 07/31/2017 12:18 PM Injection made incrementally with aspirations every 5 mL.  Performed by: Personally  Anesthesiologist: Darrin Nipper, MD  Additional Notes: 31ml lidocaine 0.5% given

## 2017-07-31 NOTE — Op Note (Signed)
07/31/2017  12:45 PM  Patient:   Jennifer Williamson  Pre-Op Diagnosis:   Right trigger thumb.  Post-Op Diagnosis:   Same  Procedure:   Release right trigger thumb.  Surgeon:   Pascal Lux, MD  Assistant:   None  Anesthesia:   Bier block  Findings:   As above.  Complications:   None  EBL:   0 cc  Fluids:   400 cc crystalloid  TT:   25 minutes at 250 mmHg  Drains:   None  Closure:   4-0 Prolene  Implants:   None  Brief Clinical Note:   The patient is a 67 year old female with a several month history of painful catching of her right thumb. These symptoms have progressed despite medications, activity modification, etc. The patient's history and examination were consistent with a right trigger thumb. The patient presents at this time for a right trigger thumb release.  Procedure:   The patient was brought into the operating room and lain in the supine position. After adequate IV sedation was achieved, a timeout was performed to verify the appropriate surgical site. A Bier block was placed by the anesthesiologist and the tourniquet inflated to 250 mmHg. The right hand and upper extremity were prepped with ChloraPrep solution before being draped sterilely. Preoperative antibiotics were administered. An approximately 1.5 cm incision was made over the volar aspect of the right at the level of the metacarpal head centered over the flexor sheath. The incision was carried down through the subcutaneous tissues with care taken to identify and protect the digital neurovascular structures. The flexor sheath was entered just proximal to the A1 pulley. The sheath was released proximally for several centimeters under direct visualization. Distally, a clamp was placed beneath the A1 pulley and used to release any adhesions. The clamp was repositioned so that one jaw was superficial to and the other jaw deep to the A1 pulley. The A1 pulley was incised on either side of the clamp to remove a 2 mm  strip of tissue. Metzenbaum scissors were used to ensure complete release of the A1 pulley more distally. The underlying tendons were carefully inspected and found to be intact.   The wound was copiously irrigated with sterile saline solution before the wound was closed using 4-0 Prolene interrupted sutures. A total of 10 cc of 0.5% plain Sensorcaine was injected in and around the incision to help with postoperative analgesia before a sterile bulky dressing was applied to the hand. The patient was then awakened and returned to the recovery room in satisfactory condition after tolerating the procedure well.

## 2017-07-31 NOTE — Anesthesia Postprocedure Evaluation (Signed)
Anesthesia Post Note  Patient: Jennifer Williamson  Procedure(s) Performed: RELEASE TRIGGER THUMB (Right Hand)  Patient location during evaluation: PACU Anesthesia Type: Bier Block Level of consciousness: awake and alert, oriented and patient cooperative Pain management: pain level controlled Vital Signs Assessment: post-procedure vital signs reviewed and stable Respiratory status: spontaneous breathing, nonlabored ventilation and respiratory function stable Cardiovascular status: blood pressure returned to baseline and stable Postop Assessment: adequate PO intake Anesthetic complications: no    Darrin Nipper

## 2017-07-31 NOTE — Transfer of Care (Signed)
Immediate Anesthesia Transfer of Care Note  Patient: Jennifer Williamson  Procedure(s) Performed: RELEASE TRIGGER THUMB (Right Hand)  Patient Location: PACU  Anesthesia Type: General, Bier Block  Level of Consciousness: awake, alert  and patient cooperative  Airway and Oxygen Therapy: Patient Spontanous Breathing and Patient connected to supplemental oxygen  Post-op Assessment: Post-op Vital signs reviewed, Patient's Cardiovascular Status Stable, Respiratory Function Stable, Patent Airway and No signs of Nausea or vomiting  Post-op Vital Signs: Reviewed and stable  Complications: No apparent anesthesia complications

## 2017-07-31 NOTE — Anesthesia Preprocedure Evaluation (Addendum)
Anesthesia Evaluation  Patient identified by MRN, date of birth, ID band Patient awake    Reviewed: Allergy & Precautions, NPO status , Patient's Chart, lab work & pertinent test results  History of Anesthesia Complications Negative for: history of anesthetic complications  Airway Mallampati: I  TM Distance: >3 FB Neck ROM: Full    Dental no notable dental hx.    Pulmonary neg pulmonary ROS,    Pulmonary exam normal breath sounds clear to auscultation       Cardiovascular Exercise Tolerance: Good negative cardio ROS Normal cardiovascular exam Rhythm:Regular Rate:Normal     Neuro/Psych negative neurological ROS     GI/Hepatic negative GI ROS,   Endo/Other  Hypothyroidism   Renal/GU negative Renal ROS     Musculoskeletal   Abdominal   Peds  Hematology negative hematology ROS (+)   Anesthesia Other Findings   Reproductive/Obstetrics                             Anesthesia Physical Anesthesia Plan  ASA: II  Anesthesia Plan: General and Bier Block and Bier Block-LIDOCAINE ONLY   Post-op Pain Management:    Induction: Intravenous  PONV Risk Score and Plan: 2 and Dexamethasone, Ondansetron and TIVA  Airway Management Planned: Natural Airway  Additional Equipment:   Intra-op Plan:   Post-operative Plan:   Informed Consent: I have reviewed the patients History and Physical, chart, labs and discussed the procedure including the risks, benefits and alternatives for the proposed anesthesia with the patient or authorized representative who has indicated his/her understanding and acceptance.     Plan Discussed with: CRNA  Anesthesia Plan Comments:        Anesthesia Quick Evaluation

## 2017-07-31 NOTE — Discharge Instructions (Signed)
General Anesthesia, Adult, Care After These instructions provide you with information about caring for yourself after your procedure. Your health care provider may also give you more specific instructions. Your treatment has been planned according to current medical practices, but problems sometimes occur. Call your health care provider if you have any problems or questions after your procedure. What can I expect after the procedure? After the procedure, it is common to have:  Vomiting.  A sore throat.  Mental slowness.  It is common to feel:  Nauseous.  Cold or shivery.  Sleepy.  Tired.  Sore or achy, even in parts of your body where you did not have surgery.  Follow these instructions at home: For at least 24 hours after the procedure:  Do not: ? Participate in activities where you could fall or become injured. ? Drive. ? Use heavy machinery. ? Drink alcohol. ? Take sleeping pills or medicines that cause drowsiness. ? Make important decisions or sign legal documents. ? Take care of children on your own.  Rest. Eating and drinking  If you vomit, drink water, juice, or soup when you can drink without vomiting.  Drink enough fluid to keep your urine clear or pale yellow.  Make sure you have little or no nausea before eating solid foods.  Follow the diet recommended by your health care provider. General instructions  Have a responsible adult stay with you until you are awake and alert.  Return to your normal activities as told by your health care provider. Ask your health care provider what activities are safe for you.  Take over-the-counter and prescription medicines only as told by your health care provider.  If you smoke, do not smoke without supervision.  Keep all follow-up visits as told by your health care provider. This is important. Contact a health care provider if:  You continue to have nausea or vomiting at home, and medicines are not helpful.  You  cannot drink fluids or start eating again.  You cannot urinate after 8-12 hours.  You develop a skin rash.  You have fever.  You have increasing redness at the site of your procedure. Get help right away if:  You have difficulty breathing.  You have chest pain.  You have unexpected bleeding.  You feel that you are having a life-threatening or urgent problem. This information is not intended to replace advice given to you by your health care provider. Make sure you discuss any questions you have with your health care provider. Document Released: 10/22/2000 Document Revised: 12/19/2015 Document Reviewed: 06/30/2015 Elsevier Interactive Patient Education  2018 [employer].   Keep dressing dry and intact. Keep hand elevated above heart level. May shower after dressing removed on postop day 4 (Sunday). Cover sutures with Band-Aids after drying off. Apply ice to affected area frequently. Take ibuprofen 600 mg TID with meals for 7-10 days, then as necessary. Take ES Tylenol or pain medication as prescribed when needed.  Return for follow-up in 10-14 days or as scheduled.

## 2017-07-31 NOTE — H&P (Signed)
Paper H&P to be scanned into permanent record. H&P reviewed and patient re-examined. No changes. 

## 2017-09-12 DIAGNOSIS — H25813 Combined forms of age-related cataract, bilateral: Secondary | ICD-10-CM | POA: Diagnosis not present

## 2017-10-09 DIAGNOSIS — D693 Immune thrombocytopenic purpura: Secondary | ICD-10-CM | POA: Diagnosis not present

## 2017-10-09 DIAGNOSIS — E042 Nontoxic multinodular goiter: Secondary | ICD-10-CM | POA: Diagnosis not present

## 2017-10-09 DIAGNOSIS — Z1231 Encounter for screening mammogram for malignant neoplasm of breast: Secondary | ICD-10-CM | POA: Diagnosis not present

## 2017-10-09 DIAGNOSIS — E7800 Pure hypercholesterolemia, unspecified: Secondary | ICD-10-CM | POA: Diagnosis not present

## 2017-10-09 DIAGNOSIS — Z0000 Encounter for general adult medical examination without abnormal findings: Secondary | ICD-10-CM | POA: Diagnosis not present

## 2017-10-10 ENCOUNTER — Other Ambulatory Visit: Payer: Self-pay | Admitting: Internal Medicine

## 2017-10-10 DIAGNOSIS — Z1231 Encounter for screening mammogram for malignant neoplasm of breast: Secondary | ICD-10-CM

## 2017-10-29 ENCOUNTER — Ambulatory Visit
Admission: RE | Admit: 2017-10-29 | Discharge: 2017-10-29 | Disposition: A | Discharge status: Home / Self Care | Payer: PPO | Source: Ambulatory Visit | Attending: Internal Medicine | Admitting: Internal Medicine

## 2017-10-29 DIAGNOSIS — Z1231 Encounter for screening mammogram for malignant neoplasm of breast: Secondary | ICD-10-CM | POA: Diagnosis not present

## 2018-03-17 DIAGNOSIS — H40153 Residual stage of open-angle glaucoma, bilateral: Secondary | ICD-10-CM | POA: Diagnosis not present

## 2018-04-14 DIAGNOSIS — E7800 Pure hypercholesterolemia, unspecified: Secondary | ICD-10-CM | POA: Diagnosis not present

## 2018-10-07 DIAGNOSIS — H25813 Combined forms of age-related cataract, bilateral: Secondary | ICD-10-CM | POA: Diagnosis not present

## 2018-10-07 DIAGNOSIS — D693 Immune thrombocytopenic purpura: Secondary | ICD-10-CM | POA: Diagnosis not present

## 2018-10-07 DIAGNOSIS — E042 Nontoxic multinodular goiter: Secondary | ICD-10-CM | POA: Diagnosis not present

## 2018-10-07 DIAGNOSIS — M8580 Other specified disorders of bone density and structure, unspecified site: Secondary | ICD-10-CM | POA: Diagnosis not present

## 2018-10-07 DIAGNOSIS — Z0000 Encounter for general adult medical examination without abnormal findings: Secondary | ICD-10-CM | POA: Diagnosis not present

## 2018-10-07 DIAGNOSIS — E7800 Pure hypercholesterolemia, unspecified: Secondary | ICD-10-CM | POA: Diagnosis not present

## 2018-10-08 ENCOUNTER — Other Ambulatory Visit: Payer: Self-pay | Admitting: Internal Medicine

## 2018-10-08 DIAGNOSIS — Z1231 Encounter for screening mammogram for malignant neoplasm of breast: Secondary | ICD-10-CM

## 2018-12-29 DIAGNOSIS — H43812 Vitreous degeneration, left eye: Secondary | ICD-10-CM | POA: Diagnosis not present

## 2019-01-06 ENCOUNTER — Ambulatory Visit
Admission: RE | Admit: 2019-01-06 | Discharge: 2019-01-06 | Disposition: A | Discharge status: Home / Self Care | Payer: PPO | Source: Ambulatory Visit | Attending: Internal Medicine | Admitting: Internal Medicine

## 2019-01-06 ENCOUNTER — Other Ambulatory Visit: Payer: Self-pay

## 2019-01-06 DIAGNOSIS — Z1231 Encounter for screening mammogram for malignant neoplasm of breast: Secondary | ICD-10-CM | POA: Insufficient documentation

## 2019-08-20 IMAGING — MG DIGITAL SCREENING BILATERAL MAMMOGRAM WITH TOMO AND CAD
8 series · 9 of 24 positions shown · non-contrast
Comparison: Previous exam(s).

CLINICAL DATA: Screening.

EXAM:
DIGITAL SCREENING BILATERAL MAMMOGRAM WITH TOMO AND CAD

[R MLO synth-2D]
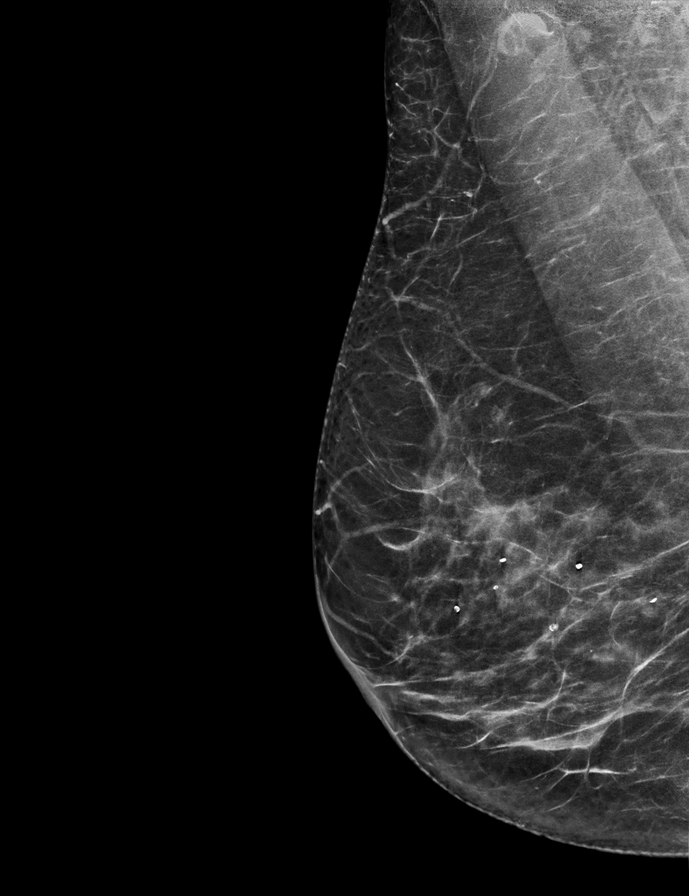

[L CC synth-2D]
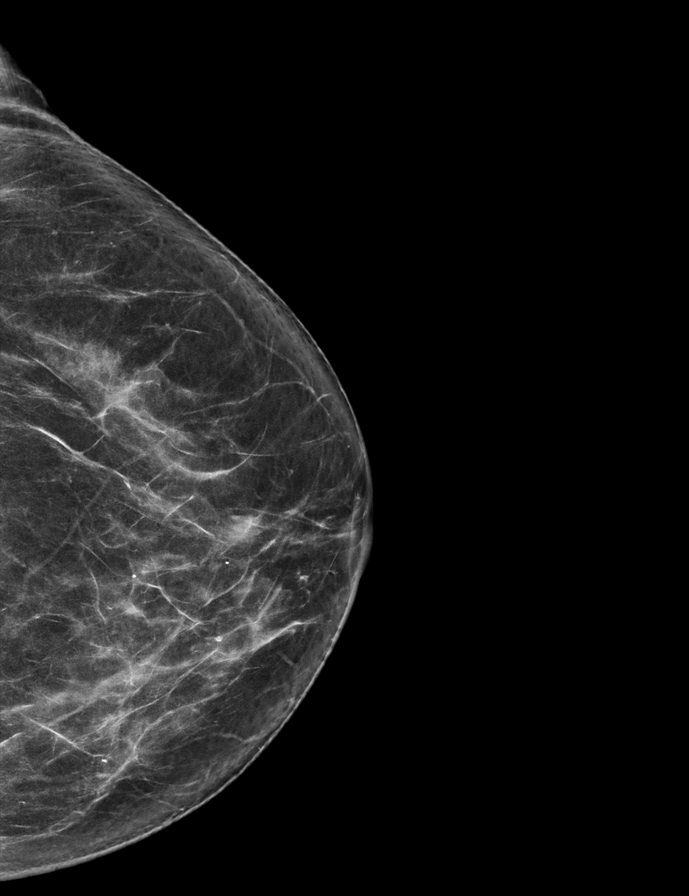

[R CC synth-2D]
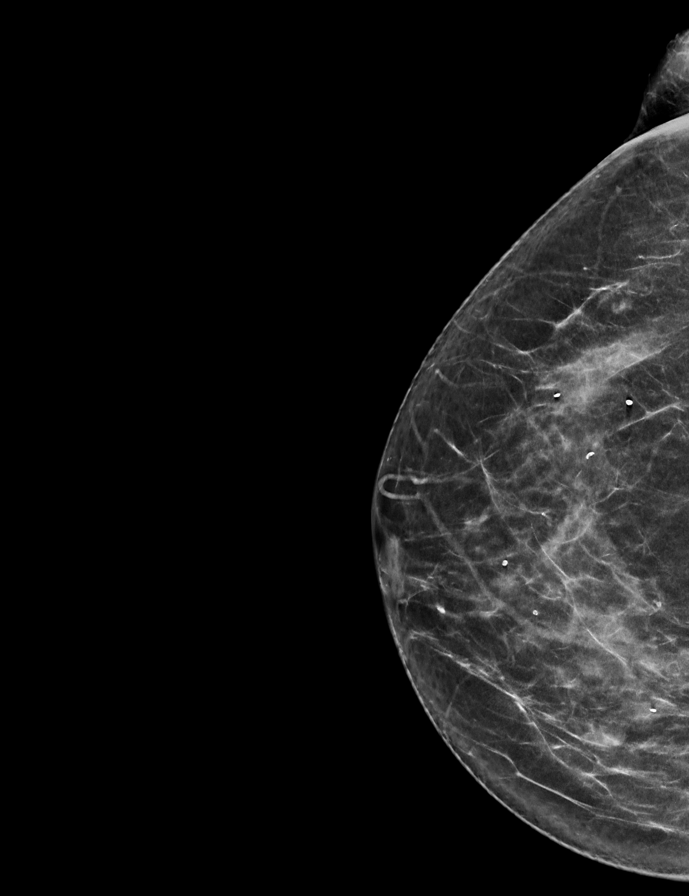

[L MLO synth-2D]
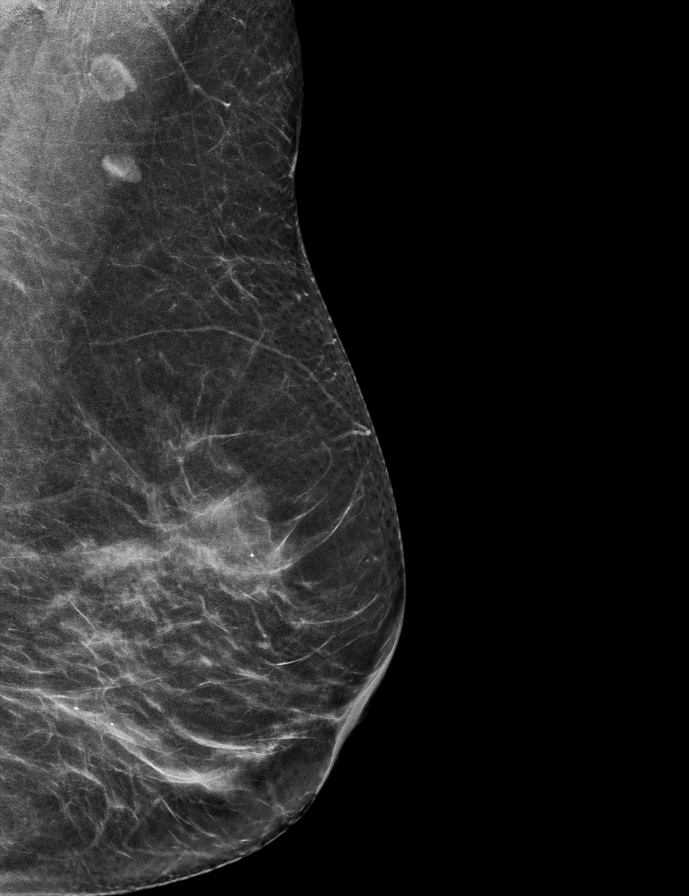

[R MLO tomo · 2 of 65 frames shown]
[frame 21/65]
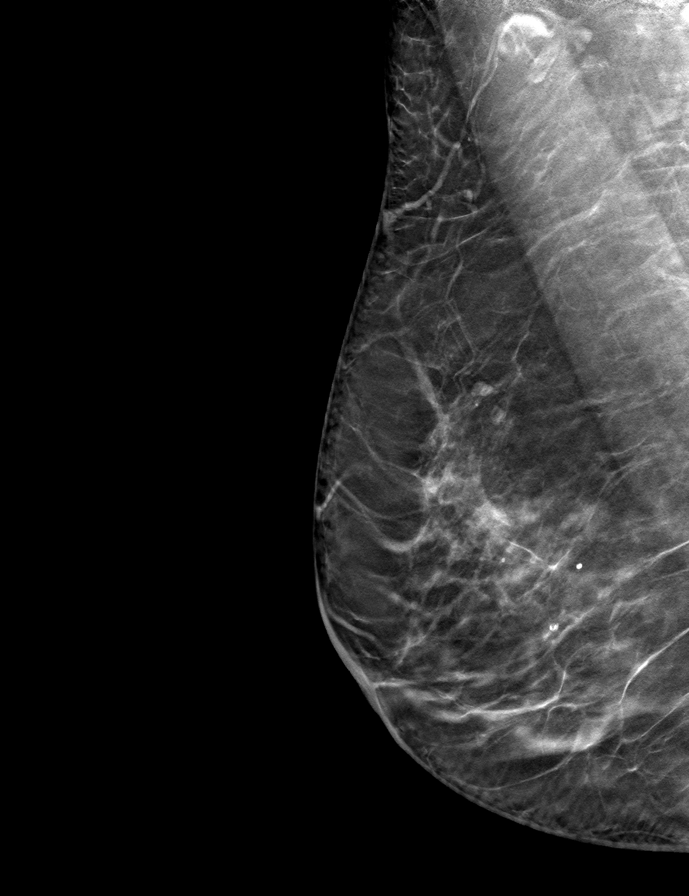
[frame 33/65]
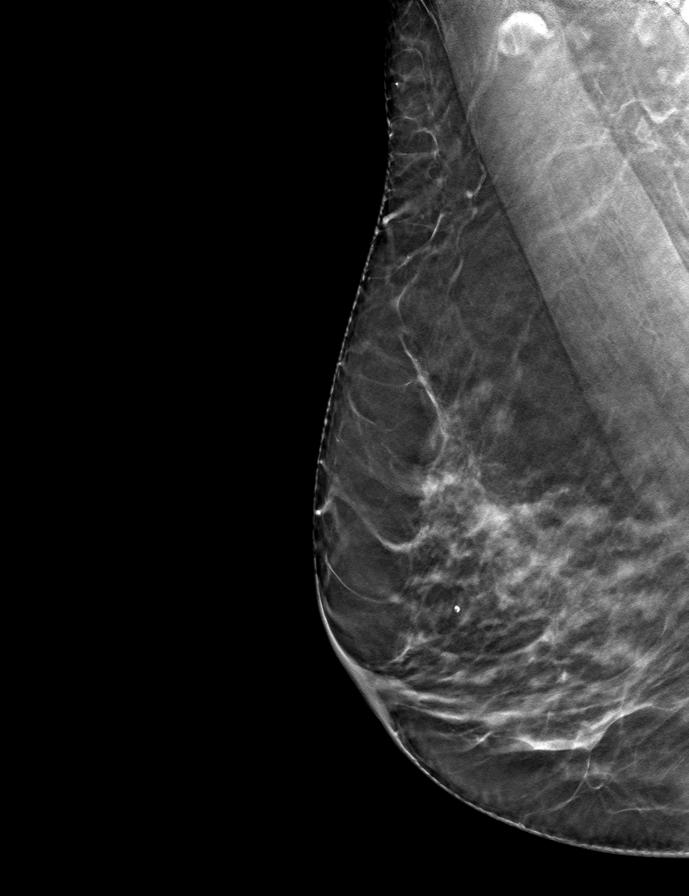

[L CC tomo · tomo slice 35/68.0]
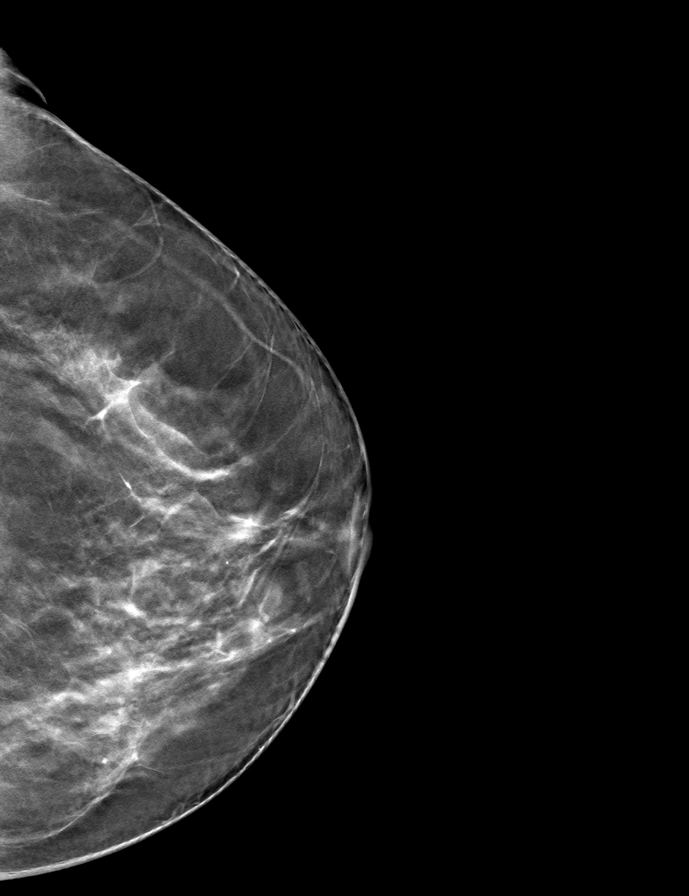

[L MLO tomo · tomo slice 33/64.0]
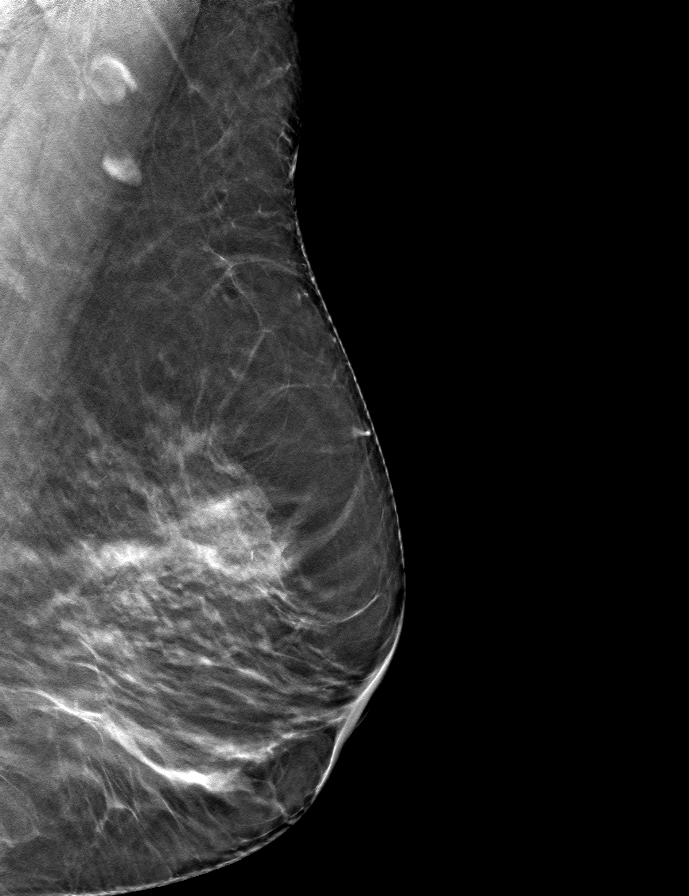

[R CC tomo · tomo slice 33/64.0]
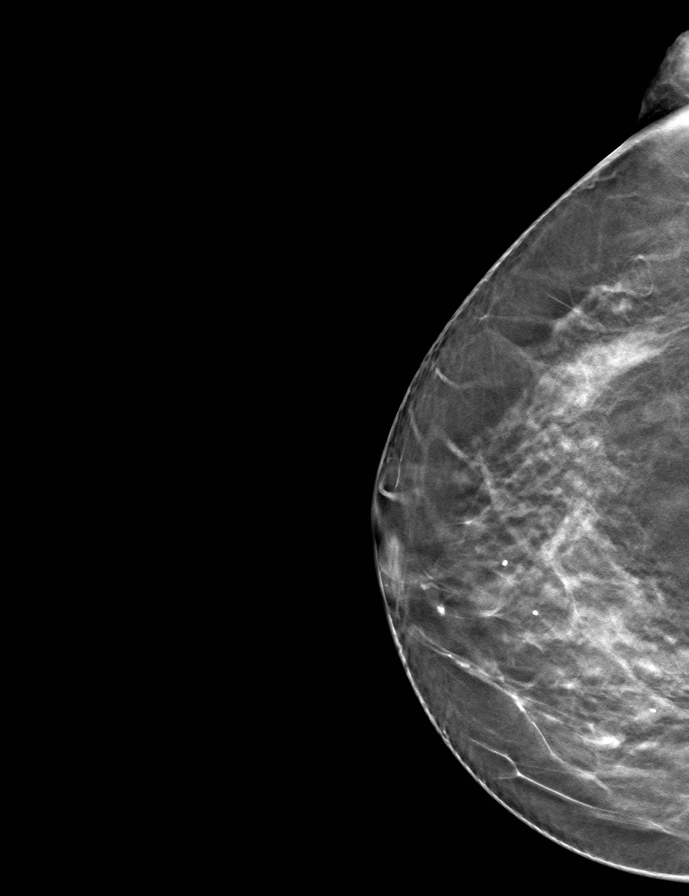

[9 of 24 positions shown; findings below may reference images not displayed]

ACR Breast Density Category b: There are scattered areas of
fibroglandular density.
FINDINGS: There are no findings suspicious for malignancy. Images were
processed with CAD.
IMPRESSION: No mammographic evidence of malignancy. A result letter of this
screening mammogram will be mailed directly to the patient.

RECOMMENDATION:
Screening mammogram in one year. (Code:CN-U-775)

BI-RADS CATEGORY  1: Negative.

## 2019-10-05 ENCOUNTER — Ambulatory Visit: Payer: PPO | Attending: Internal Medicine

## 2019-10-05 DIAGNOSIS — Z23 Encounter for immunization: Secondary | ICD-10-CM

## 2019-10-05 NOTE — Progress Notes (Signed)
   Covid-19 Vaccination Clinic  Name:  Jennifer Williamson    MRN: [id number] DOB: 12/01/1950  10/05/2019  [name] was observed post Covid-19 immunization for 15 minutes without incident. She was provided with Vaccine Information Sheet and instruction to access the V-Safe system.   [name] was instructed to call 911 with any severe reactions post vaccine: Marland Kitchen Difficulty breathing  . Swelling of face and throat  . A fast heartbeat  . A bad rash all over body  . Dizziness and weakness   Immunizations Administered    Name Date Dose VIS Date Route   Pfizer COVID-19 Vaccine 10/05/2019  9:38 AM 0.3 mL 07/10/2019 Intramuscular   Manufacturer: [place]   Lot: [id number]   [place]: [id number]

## 2019-10-08 DIAGNOSIS — E042 Nontoxic multinodular goiter: Secondary | ICD-10-CM | POA: Diagnosis not present

## 2019-10-08 DIAGNOSIS — Z0000 Encounter for general adult medical examination without abnormal findings: Secondary | ICD-10-CM | POA: Diagnosis not present

## 2019-10-08 DIAGNOSIS — E7800 Pure hypercholesterolemia, unspecified: Secondary | ICD-10-CM | POA: Diagnosis not present

## 2019-10-08 DIAGNOSIS — M8580 Other specified disorders of bone density and structure, unspecified site: Secondary | ICD-10-CM | POA: Diagnosis not present

## 2019-10-08 DIAGNOSIS — D693 Immune thrombocytopenic purpura: Secondary | ICD-10-CM | POA: Diagnosis not present

## 2019-10-27 ENCOUNTER — Ambulatory Visit: Payer: PPO | Attending: Internal Medicine

## 2019-10-27 DIAGNOSIS — Z23 Encounter for immunization: Secondary | ICD-10-CM

## 2019-10-27 NOTE — Progress Notes (Signed)
   Covid-19 Vaccination Clinic  Name:  Jennifer Williamson    MRN: [id number] DOB: 09-Feb-1951  10/27/2019  Ms. Trach was observed post Covid-19 immunization for 15 minutes without incident. She was provided with Vaccine Information Sheet and instruction to access the V-Safe system.   [name] was instructed to call 911 with any severe reactions post vaccine: Marland Kitchen Difficulty breathing  . Swelling of face and throat  . A fast heartbeat  . A bad rash all over body  . Dizziness and weakness   Immunizations Administered    Name Date Dose VIS Date Route   Pfizer COVID-19 Vaccine 10/27/2019 10:45 AM 0.3 mL 07/10/2019 Intramuscular   Manufacturer: [place]   Lot: [id number]   [place]: [id number]

## 2020-10-10 ENCOUNTER — Other Ambulatory Visit: Payer: Self-pay | Admitting: Internal Medicine

## 2020-10-10 DIAGNOSIS — E7800 Pure hypercholesterolemia, unspecified: Secondary | ICD-10-CM | POA: Diagnosis not present

## 2020-10-10 DIAGNOSIS — Z1231 Encounter for screening mammogram for malignant neoplasm of breast: Secondary | ICD-10-CM | POA: Diagnosis not present

## 2020-10-10 DIAGNOSIS — Z0000 Encounter for general adult medical examination without abnormal findings: Secondary | ICD-10-CM | POA: Diagnosis not present

## 2020-10-10 DIAGNOSIS — D693 Immune thrombocytopenic purpura: Secondary | ICD-10-CM | POA: Diagnosis not present

## 2020-10-10 DIAGNOSIS — E042 Nontoxic multinodular goiter: Secondary | ICD-10-CM | POA: Diagnosis not present

## 2020-10-28 ENCOUNTER — Ambulatory Visit
Admission: RE | Admit: 2020-10-28 | Discharge: 2020-10-28 | Disposition: A | Discharge status: Home / Self Care | Payer: PPO | Source: Ambulatory Visit | Attending: Internal Medicine | Admitting: Internal Medicine

## 2020-10-28 ENCOUNTER — Other Ambulatory Visit: Payer: Self-pay

## 2020-10-28 DIAGNOSIS — Z1231 Encounter for screening mammogram for malignant neoplasm of breast: Secondary | ICD-10-CM | POA: Insufficient documentation

## 2020-11-07 DIAGNOSIS — H25813 Combined forms of age-related cataract, bilateral: Secondary | ICD-10-CM | POA: Diagnosis not present

## 2021-01-11 DIAGNOSIS — E7800 Pure hypercholesterolemia, unspecified: Secondary | ICD-10-CM | POA: Diagnosis not present

## 2022-03-26 DIAGNOSIS — E559 Vitamin D deficiency, unspecified: Secondary | ICD-10-CM | POA: Diagnosis not present

## 2022-03-26 DIAGNOSIS — E785 Hyperlipidemia, unspecified: Secondary | ICD-10-CM | POA: Diagnosis not present

## 2022-03-26 DIAGNOSIS — E039 Hypothyroidism, unspecified: Secondary | ICD-10-CM | POA: Diagnosis not present

## 2022-03-26 DIAGNOSIS — G8929 Other chronic pain: Secondary | ICD-10-CM | POA: Diagnosis not present

## 2022-03-26 DIAGNOSIS — E663 Overweight: Secondary | ICD-10-CM | POA: Diagnosis not present

## 2022-03-26 DIAGNOSIS — G4700 Insomnia, unspecified: Secondary | ICD-10-CM | POA: Diagnosis not present

## 2022-03-28 DIAGNOSIS — D693 Immune thrombocytopenic purpura: Secondary | ICD-10-CM | POA: Diagnosis not present

## 2022-03-28 DIAGNOSIS — E7800 Pure hypercholesterolemia, unspecified: Secondary | ICD-10-CM | POA: Diagnosis not present

## 2022-03-28 DIAGNOSIS — E042 Nontoxic multinodular goiter: Secondary | ICD-10-CM | POA: Diagnosis not present

## 2022-03-28 DIAGNOSIS — R7309 Other abnormal glucose: Secondary | ICD-10-CM | POA: Diagnosis not present

## 2022-03-29 DIAGNOSIS — M7918 Myalgia, other site: Secondary | ICD-10-CM | POA: Diagnosis not present

## 2022-03-29 DIAGNOSIS — D693 Immune thrombocytopenic purpura: Secondary | ICD-10-CM | POA: Diagnosis not present

## 2022-03-29 DIAGNOSIS — E042 Nontoxic multinodular goiter: Secondary | ICD-10-CM | POA: Diagnosis not present

## 2022-03-29 DIAGNOSIS — E7800 Pure hypercholesterolemia, unspecified: Secondary | ICD-10-CM | POA: Diagnosis not present

## 2022-03-29 DIAGNOSIS — M1611 Unilateral primary osteoarthritis, right hip: Secondary | ICD-10-CM | POA: Diagnosis not present

## 2022-03-29 DIAGNOSIS — Z0000 Encounter for general adult medical examination without abnormal findings: Secondary | ICD-10-CM | POA: Diagnosis not present

## 2022-03-29 DIAGNOSIS — M545 Low back pain, unspecified: Secondary | ICD-10-CM | POA: Diagnosis not present

## 2022-03-29 DIAGNOSIS — R7303 Prediabetes: Secondary | ICD-10-CM | POA: Diagnosis not present

## 2022-03-30 ENCOUNTER — Other Ambulatory Visit: Payer: Self-pay | Admitting: Internal Medicine

## 2022-03-30 DIAGNOSIS — Z1231 Encounter for screening mammogram for malignant neoplasm of breast: Secondary | ICD-10-CM

## 2022-04-12 ENCOUNTER — Ambulatory Visit
Admission: RE | Admit: 2022-04-12 | Discharge: 2022-04-12 | Disposition: A | Discharge status: Home / Self Care | Payer: PPO | Source: Ambulatory Visit | Attending: Internal Medicine | Admitting: Internal Medicine

## 2022-04-12 DIAGNOSIS — Z1231 Encounter for screening mammogram for malignant neoplasm of breast: Secondary | ICD-10-CM | POA: Insufficient documentation

## 2022-11-06 DIAGNOSIS — H25813 Combined forms of age-related cataract, bilateral: Secondary | ICD-10-CM | POA: Diagnosis not present

## 2022-11-30 DIAGNOSIS — L821 Other seborrheic keratosis: Secondary | ICD-10-CM | POA: Diagnosis not present

## 2022-11-30 DIAGNOSIS — D224 Melanocytic nevi of scalp and neck: Secondary | ICD-10-CM | POA: Diagnosis not present

## 2022-11-30 DIAGNOSIS — D225 Melanocytic nevi of trunk: Secondary | ICD-10-CM | POA: Diagnosis not present

## 2022-11-30 DIAGNOSIS — D2239 Melanocytic nevi of other parts of face: Secondary | ICD-10-CM | POA: Diagnosis not present

## 2022-12-03 DIAGNOSIS — M4727 Other spondylosis with radiculopathy, lumbosacral region: Secondary | ICD-10-CM | POA: Diagnosis not present

## 2022-12-03 DIAGNOSIS — M4316 Spondylolisthesis, lumbar region: Secondary | ICD-10-CM | POA: Diagnosis not present

## 2022-12-14 DIAGNOSIS — M25571 Pain in right ankle and joints of right foot: Secondary | ICD-10-CM | POA: Diagnosis not present

## 2022-12-14 DIAGNOSIS — S40811A Abrasion of right upper arm, initial encounter: Secondary | ICD-10-CM | POA: Diagnosis not present

## 2022-12-14 DIAGNOSIS — S82401A Unspecified fracture of shaft of right fibula, initial encounter for closed fracture: Secondary | ICD-10-CM | POA: Diagnosis not present

## 2022-12-14 DIAGNOSIS — S93401A Sprain of unspecified ligament of right ankle, initial encounter: Secondary | ICD-10-CM | POA: Diagnosis not present

## 2022-12-14 DIAGNOSIS — W010XXA Fall on same level from slipping, tripping and stumbling without subsequent striking against object, initial encounter: Secondary | ICD-10-CM | POA: Diagnosis not present

## 2022-12-14 DIAGNOSIS — Z23 Encounter for immunization: Secondary | ICD-10-CM | POA: Diagnosis not present

## 2022-12-26 DIAGNOSIS — M4316 Spondylolisthesis, lumbar region: Secondary | ICD-10-CM | POA: Diagnosis not present

## 2022-12-26 DIAGNOSIS — M4727 Other spondylosis with radiculopathy, lumbosacral region: Secondary | ICD-10-CM | POA: Diagnosis not present

## 2023-01-02 DIAGNOSIS — M4316 Spondylolisthesis, lumbar region: Secondary | ICD-10-CM | POA: Diagnosis not present

## 2023-01-02 DIAGNOSIS — M4727 Other spondylosis with radiculopathy, lumbosacral region: Secondary | ICD-10-CM | POA: Diagnosis not present

## 2023-01-09 DIAGNOSIS — M4727 Other spondylosis with radiculopathy, lumbosacral region: Secondary | ICD-10-CM | POA: Diagnosis not present

## 2023-01-09 DIAGNOSIS — M4316 Spondylolisthesis, lumbar region: Secondary | ICD-10-CM | POA: Diagnosis not present

## 2023-02-05 DIAGNOSIS — H25813 Combined forms of age-related cataract, bilateral: Secondary | ICD-10-CM | POA: Diagnosis not present

## 2023-04-09 DIAGNOSIS — R7303 Prediabetes: Secondary | ICD-10-CM | POA: Diagnosis not present

## 2023-04-09 DIAGNOSIS — E042 Nontoxic multinodular goiter: Secondary | ICD-10-CM | POA: Diagnosis not present

## 2023-04-09 DIAGNOSIS — E7800 Pure hypercholesterolemia, unspecified: Secondary | ICD-10-CM | POA: Diagnosis not present

## 2023-04-16 ENCOUNTER — Other Ambulatory Visit: Payer: Self-pay | Admitting: Internal Medicine

## 2023-04-16 DIAGNOSIS — E7800 Pure hypercholesterolemia, unspecified: Secondary | ICD-10-CM | POA: Diagnosis not present

## 2023-04-16 DIAGNOSIS — Z1231 Encounter for screening mammogram for malignant neoplasm of breast: Secondary | ICD-10-CM | POA: Diagnosis not present

## 2023-04-16 DIAGNOSIS — Z23 Encounter for immunization: Secondary | ICD-10-CM | POA: Diagnosis not present

## 2023-04-16 DIAGNOSIS — Z0000 Encounter for general adult medical examination without abnormal findings: Secondary | ICD-10-CM | POA: Diagnosis not present

## 2023-04-16 DIAGNOSIS — R7303 Prediabetes: Secondary | ICD-10-CM | POA: Diagnosis not present

## 2023-04-16 DIAGNOSIS — E042 Nontoxic multinodular goiter: Secondary | ICD-10-CM | POA: Diagnosis not present

## 2023-04-19 ENCOUNTER — Ambulatory Visit
Admission: RE | Admit: 2023-04-19 | Discharge: 2023-04-19 | Disposition: A | Discharge status: Home / Self Care | Payer: PPO | Source: Ambulatory Visit | Attending: Internal Medicine | Admitting: Internal Medicine

## 2023-04-19 DIAGNOSIS — Z1231 Encounter for screening mammogram for malignant neoplasm of breast: Secondary | ICD-10-CM | POA: Insufficient documentation

## 2023-04-25 DIAGNOSIS — Z09 Encounter for follow-up examination after completed treatment for conditions other than malignant neoplasm: Secondary | ICD-10-CM | POA: Diagnosis not present

## 2023-04-25 DIAGNOSIS — Z86010 Personal history of colonic polyps: Secondary | ICD-10-CM | POA: Diagnosis not present

## 2023-05-30 DIAGNOSIS — E042 Nontoxic multinodular goiter: Secondary | ICD-10-CM | POA: Diagnosis not present

## 2023-05-30 DIAGNOSIS — I10 Essential (primary) hypertension: Secondary | ICD-10-CM | POA: Diagnosis not present

## 2023-05-30 DIAGNOSIS — R7303 Prediabetes: Secondary | ICD-10-CM | POA: Diagnosis not present

## 2023-06-10 DIAGNOSIS — H40153 Residual stage of open-angle glaucoma, bilateral: Secondary | ICD-10-CM | POA: Diagnosis not present

## 2023-06-24 ENCOUNTER — Encounter: Payer: Self-pay | Admitting: Gastroenterology

## 2023-07-22 ENCOUNTER — Encounter: Payer: Self-pay | Admitting: Gastroenterology

## 2023-07-26 ENCOUNTER — Ambulatory Visit: Payer: PPO | Admitting: Anesthesiology

## 2023-07-26 ENCOUNTER — Other Ambulatory Visit: Payer: Self-pay

## 2023-07-26 ENCOUNTER — Encounter: Payer: Self-pay | Admitting: Gastroenterology

## 2023-07-26 ENCOUNTER — Encounter
Admission: RE | Disposition: A | Discharge status: Home / Self Care | Payer: Self-pay | Source: Ambulatory Visit | Attending: Gastroenterology

## 2023-07-26 ENCOUNTER — Ambulatory Visit
Admission: RE | Admit: 2023-07-26 | Discharge: 2023-07-26 | Disposition: A | Discharge status: Home / Self Care | Payer: PPO | Source: Ambulatory Visit | Attending: Gastroenterology | Admitting: Gastroenterology

## 2023-07-26 DIAGNOSIS — Z83719 Family history of colon polyps, unspecified: Secondary | ICD-10-CM | POA: Insufficient documentation

## 2023-07-26 DIAGNOSIS — D123 Benign neoplasm of transverse colon: Secondary | ICD-10-CM | POA: Diagnosis not present

## 2023-07-26 DIAGNOSIS — D124 Benign neoplasm of descending colon: Secondary | ICD-10-CM | POA: Insufficient documentation

## 2023-07-26 DIAGNOSIS — K644 Residual hemorrhoidal skin tags: Secondary | ICD-10-CM | POA: Diagnosis not present

## 2023-07-26 DIAGNOSIS — I10 Essential (primary) hypertension: Secondary | ICD-10-CM | POA: Diagnosis not present

## 2023-07-26 DIAGNOSIS — K5730 Diverticulosis of large intestine without perforation or abscess without bleeding: Secondary | ICD-10-CM | POA: Diagnosis not present

## 2023-07-26 DIAGNOSIS — Z1211 Encounter for screening for malignant neoplasm of colon: Secondary | ICD-10-CM | POA: Insufficient documentation

## 2023-07-26 DIAGNOSIS — E039 Hypothyroidism, unspecified: Secondary | ICD-10-CM | POA: Insufficient documentation

## 2023-07-26 DIAGNOSIS — K635 Polyp of colon: Secondary | ICD-10-CM | POA: Diagnosis not present

## 2023-07-26 DIAGNOSIS — K648 Other hemorrhoids: Secondary | ICD-10-CM | POA: Insufficient documentation

## 2023-07-26 DIAGNOSIS — Z860101 Personal history of adenomatous and serrated colon polyps: Secondary | ICD-10-CM | POA: Diagnosis not present

## 2023-07-26 DIAGNOSIS — K649 Unspecified hemorrhoids: Secondary | ICD-10-CM | POA: Diagnosis not present

## 2023-07-26 HISTORY — PX: POLYPECTOMY: SHX5525

## 2023-07-26 HISTORY — DX: Pure hypercholesterolemia, unspecified: E78.00

## 2023-07-26 HISTORY — PX: COLONOSCOPY WITH PROPOFOL: SHX5780

## 2023-07-26 HISTORY — DX: Other spondylosis with radiculopathy, lumbosacral region: M47.27

## 2023-07-26 HISTORY — DX: Nontoxic multinodular goiter: E04.2

## 2023-07-26 HISTORY — DX: Essential (primary) hypertension: I10

## 2023-07-26 SURGERY — COLONOSCOPY WITH PROPOFOL
Anesthesia: General

## 2023-07-26 MED ORDER — PROPOFOL 1000 MG/100ML IV EMUL
INTRAVENOUS | Status: AC
  Filled 2023-07-26: qty 100

## 2023-07-26 MED ORDER — ESMOLOL HCL 100 MG/10ML IV SOLN
INTRAVENOUS | Status: AC
  Filled 2023-07-26: qty 10

## 2023-07-26 MED ORDER — EPHEDRINE 5 MG/ML INJ
INTRAVENOUS | Status: AC
  Filled 2023-07-26: qty 5

## 2023-07-26 MED ORDER — ESMOLOL HCL 100 MG/10ML IV SOLN
INTRAVENOUS | Status: DC | PRN
  Administered 2023-07-26: 10 mg via INTRAVENOUS

## 2023-07-26 MED ORDER — PROPOFOL 10 MG/ML IV BOLUS
INTRAVENOUS | Status: DC | PRN
  Administered 2023-07-26: 50 mg via INTRAVENOUS

## 2023-07-26 MED ORDER — GLYCOPYRROLATE 0.2 MG/ML IJ SOLN
INTRAMUSCULAR | Status: AC
  Filled 2023-07-26: qty 1

## 2023-07-26 MED ORDER — EPHEDRINE SULFATE-NACL 50-0.9 MG/10ML-% IV SOSY
PREFILLED_SYRINGE | INTRAVENOUS | Status: DC | PRN
  Administered 2023-07-26: 10 mg via INTRAVENOUS
  Administered 2023-07-26: 15 mg via INTRAVENOUS

## 2023-07-26 MED ORDER — PROPOFOL 500 MG/50ML IV EMUL
INTRAVENOUS | Status: DC | PRN
  Administered 2023-07-26: 75 ug/kg/min via INTRAVENOUS

## 2023-07-26 MED ORDER — GLYCOPYRROLATE 0.2 MG/ML IJ SOLN
INTRAMUSCULAR | Status: DC | PRN
  Administered 2023-07-26: .2 mg via INTRAVENOUS

## 2023-07-26 MED ORDER — SODIUM CHLORIDE 0.9 % IV SOLN: INTRAVENOUS | Status: DC

## 2023-07-26 MED ORDER — DEXMEDETOMIDINE HCL IN NACL 80 MCG/20ML IV SOLN
INTRAVENOUS | Status: DC | PRN
  Administered 2023-07-26: 12 ug via INTRAVENOUS
  Administered 2023-07-26: 8 ug via INTRAVENOUS

## 2023-07-26 MED ORDER — LIDOCAINE HCL (CARDIAC) PF 100 MG/5ML IV SOSY
PREFILLED_SYRINGE | INTRAVENOUS | Status: DC | PRN
  Administered 2023-07-26: 60 mg via INTRAVENOUS

## 2023-07-26 MED ORDER — LIDOCAINE HCL (PF) 2 % IJ SOLN
INTRAMUSCULAR | Status: AC
  Filled 2023-07-26: qty 5

## 2023-07-26 NOTE — Op Note (Signed)
[facility] Gastroenterology Patient Name: Jennifer Williamson Procedure Date: 07/26/2023 9:29 AM MRN: [id number] Account #: [id number] Date of Birth: 05-27-1951 Admit Type: Outpatient Age: 72 Room: [facility] ENDO ROOM 1 Gender: Female Note Status: Finalized Instrument Name: Peds Colonoscope [id number] Procedure:             Colonoscopy Indications:           High risk colon cancer surveillance: Personal history                         of colonic polyps Providers:             Trenda Moots, DO Referring MD:          Marya Amsler. Dareen Piano MD, MD (Referring MD) Medicines:             Monitored Anesthesia Care Complications:         No immediate complications. Estimated blood loss:                         Minimal. Procedure:             Pre-Anesthesia Assessment:                        - Prior to the procedure, a History and Physical was                         performed, and patient medications and allergies were                         reviewed. The patient is competent. The risks and                         benefits of the procedure and the sedation options and                         risks were discussed with the patient. All questions                         were answered and informed consent was obtained.                         Patient identification and proposed procedure were                         verified by the physician, the nurse, the anesthetist                         and the technician in the endoscopy suite. Mental                         Status Examination: alert and oriented. Airway                         Examination: normal oropharyngeal airway and neck                         mobility. Respiratory Examination: clear to  auscultation. CV Examination: RRR, no murmurs, no S3                         or S4. Prophylactic Antibiotics: The patient does not                         require prophylactic antibiotics. Prior                          Anticoagulants: The patient has taken no anticoagulant                         or antiplatelet agents. ASA Grade Assessment: II - A                         patient with mild systemic disease. After reviewing                         the risks and benefits, the patient was deemed in                         satisfactory condition to undergo the procedure. The                         anesthesia plan was to use monitored anesthesia care                         (MAC). Immediately prior to administration of                         medications, the patient was re-assessed for adequacy                         to receive sedatives. The heart rate, respiratory                         rate, oxygen saturations, blood pressure, adequacy of                         pulmonary ventilation, and response to care were                         monitored throughout the procedure. The physical                         status of the patient was re-assessed after the                         procedure.                        After obtaining informed consent, the colonoscope was                         passed under direct vision. Throughout the procedure,                         the patient's blood pressure, pulse, and oxygen  saturations were monitored continuously. The                         Colonoscope was introduced through the anus and                         advanced to the the terminal ileum, with                         identification of the appendiceal orifice and IC                         valve. The colonoscopy was performed without                         difficulty. The patient tolerated the procedure well.                         The quality of the bowel preparation was evaluated                         using the BBPS [facility] Bowel Preparation Scale) with                         scores of: Right Colon = 3, Transverse Colon = 3 and                         Left Colon = 3  (entire mucosa seen well with no                         residual staining, small fragments of stool or opaque                         liquid). The total BBPS score equals 9. The terminal                         ileum, ileocecal valve, appendiceal orifice, and                         rectum were photographed. Findings:      Hemorrhoids were found on perianal exam.      The digital rectal exam was normal. Pertinent negatives include normal       sphincter tone.      The terminal ileum appeared normal. Estimated blood loss: none.      Retroflexion in the right colon was performed.      Two sessile polyps were found in the descending colon and transverse       colon. The polyps were 1 to 2 mm in size. These polyps were removed with       a jumbo cold forceps. Resection and retrieval were complete. Estimated       blood loss was minimal.      Multiple small-mouthed diverticula were found in the left colon.       Estimated blood loss: none.      Non-bleeding external and internal hemorrhoids were found during       retroflexion and during perianal exam. The hemorrhoids were medium-sized.      The exam was otherwise  without abnormality on direct and retroflexion       views. Impression:            - Hemorrhoids found on perianal exam.                        - The examined portion of the ileum was normal.                        - Two 1 to 2 mm polyps in the descending colon and in                         the transverse colon, removed with a jumbo cold                         forceps. Resected and retrieved.                        - Diverticulosis in the left colon.                        - Non-bleeding external and internal hemorrhoids.                        - The examination was otherwise normal on direct and                         retroflexion views. Recommendation:        - Patient has a contact number available for                         emergencies. The signs and symptoms of potential                          delayed complications were discussed with the patient.                         Return to normal activities tomorrow. Written                         discharge instructions were provided to the patient.                        - Discharge patient to home.                        - Resume previous diet.                        - Continue present medications.                        - Await pathology results.                        - Repeat colonoscopy for surveillance based on                         pathology results.                        -  Return to referring physician as previously                         scheduled.                        - The findings and recommendations were discussed with                         the patient. Procedure Code(s):     --- Professional ---                        [phone], Colonoscopy, flexible; with biopsy, single or                         multiple Diagnosis Code(s):     --- Professional ---                        Z86.010, Personal history of colonic polyps                        K64.8, Other hemorrhoids                        D12.4, Benign neoplasm of descending colon                        D12.3, Benign neoplasm of transverse colon (hepatic                         flexure or splenic flexure)                        K57.30, Diverticulosis of large intestine without                         perforation or abscess without bleeding CPT copyright 2022 American Medical Association. All rights reserved. The codes documented in this report are preliminary and upon coder review may  be revised to meet current compliance requirements. Attending Participation:      I personally performed the entire procedure. Elfredia Nevins, DO Jaynie Collins DO, DO 07/26/2023 10:03:05 AM This report has been signed electronically. Number of Addenda: 0 Note Initiated On: 07/26/2023 9:29 AM Scope Withdrawal Time: 0 hours 10 minutes 9 seconds  Total  Procedure Duration: 0 hours 17 minutes 57 seconds  Estimated Blood Loss:  Estimated blood loss was minimal.      [facility]

## 2023-07-26 NOTE — Interval H&P Note (Signed)
History and Physical Interval Note: Preprocedure H&P from 07/26/23  was reviewed and there was no interval change after seeing and examining the patient.  Written consent was obtained from the patient after discussion of risks, benefits, and alternatives. Patient has consented to proceed with Colonoscopy with possible intervention   07/26/2023 9:28 AM  Jennifer Williamson  has presented today for surgery, with the diagnosis of Z86.0101 (ICD-10-CM) - Hx of adenomatous colonic polyps.  The various methods of treatment have been discussed with the patient and family. After consideration of risks, benefits and other options for treatment, the patient has consented to  Procedure(s): COLONOSCOPY WITH PROPOFOL (N/A) as a surgical intervention.  The patient's history has been reviewed, patient examined, no change in status, stable for surgery.  I have reviewed the patient's chart and labs.  Questions were answered to the patient's satisfaction.     Jaynie Collins

## 2023-07-26 NOTE — Anesthesia Postprocedure Evaluation (Signed)
Anesthesia Post Note  Patient: Jennifer Williamson  Procedure(s) Performed: COLONOSCOPY WITH PROPOFOL POLYPECTOMY  Patient location during evaluation: PACU Anesthesia Type: General Level of consciousness: awake and awake and alert Pain management: satisfactory to patient Vital Signs Assessment: post-procedure vital signs reviewed and stable Respiratory status: spontaneous breathing Cardiovascular status: stable Anesthetic complications: no   No notable events documented.   Last Vitals:  Vitals:   07/26/23 0918 07/26/23 1000  BP: (!) 148/97 (!) 85/60  Pulse: 93   Resp: 18   Temp: (!) 36.1 C (!) 36 C  SpO2: 98%     Last Pain:  Vitals:   07/26/23 1030  TempSrc:   PainSc: 0-No pain                 VAN STAVEREN,Shanai Lartigue

## 2023-07-26 NOTE — H&P (Signed)
Pre-Procedure H&P   Patient ID: Jennifer Williamson is a 72 y.o. female.  Gastroenterology Provider: Jaynie Collins, DO  Referring Provider: Fransico Setters, NP PCP: Lauro Regulus, MD  Date: 07/26/2023  HPI Ms. Jennifer Williamson is a 72 y.o. female who presents today for Colonoscopy for surveillance- phx colon polyps . Patient with a personal history of colon polyps.  Last underwent colonoscopy in December 2018 demonstrating 2 adenomatous polyps.  Left-sided diverticulosis and internal hemorrhoids were also appreciated.  Colonoscopy prior in 2013 internal hemorrhoids and diverticulosis.  Sister with history of colon polyps Patient status post appendectomy partial liver resection and cholecystectomy Family history of pancreatic cancer    Past Medical History:  Diagnosis Date   Colon polyps    High cholesterol    History of shingles 06/19/2017   right shoulder   Hypertension    Hypothyroidism    Idiopathic thrombocytopenia (HCC)    Lumbosacral spondylosis with radiculopathy    Non-toxic multinodular goiter    Osteopenia    Pure hypercholesterolemia     Past Surgical History:  Procedure Laterality Date   APPENDECTOMY     CHOLECYSTECTOMY     COLONOSCOPY     COLONOSCOPY WITH PROPOFOL N/A 07/17/2017   Procedure: COLONOSCOPY WITH PROPOFOL;  Surgeon: Scot Jun, MD;  Location: [facility] ENDOSCOPY;  Service: Endoscopy;  Laterality: N/A;   FRACTURE SURGERY     LIVER SURGERY  2000   OPEN REDUCTION INTERNAL FIXATION (ORIF) DISTAL RADIAL FRACTURE Right 09/13/2015   Procedure: OPEN REDUCTION INTERNAL FIXATION (ORIF) DISTAL RADIAL FRACTURE;  Surgeon: Christena Flake, MD;  Location: [facility] ORS;  Service: Orthopedics;  Laterality: Right;   ORGAN HARVEST     LIVER LOBE LIVING DONOR   STERIOD INJECTION Right 10/10/2016   Procedure: STEROID INJECTION;  Surgeon: Christena Flake, MD;  Location: [facility];  Service: Orthopedics;  Laterality: Right;   TRIGGER FINGER RELEASE Left  10/10/2016   Procedure: RELEASE TRIGGER FINGER;  Surgeon: Christena Flake, MD;  Location: [facility];  Service: Orthopedics;  Laterality: Left;   TRIGGER FINGER RELEASE Right 07/31/2017   Procedure: RELEASE TRIGGER THUMB;  Surgeon: Christena Flake, MD;  Location: [facility];  Service: Orthopedics;  Laterality: Right;   TUBAL LIGATION      Family History Fhx pancreatic cancer Sister- colon polyps No other h/o GI disease or malignancy  Review of Systems  Constitutional:  Negative for activity change, appetite change, chills, diaphoresis, fatigue, fever and unexpected weight change.  HENT:  Negative for trouble swallowing and voice change.   Respiratory:  Negative for shortness of breath and wheezing.   Cardiovascular:  Negative for chest pain, palpitations and leg swelling.  Gastrointestinal:  Negative for abdominal distention, abdominal pain, anal bleeding, blood in stool, constipation, diarrhea, nausea, rectal pain and vomiting.  Musculoskeletal:  Negative for arthralgias and myalgias.  Skin:  Negative for color change and pallor.  Neurological:  Negative for dizziness, syncope and weakness.  Psychiatric/Behavioral:  Negative for confusion.   All other systems reviewed and are negative.    Medications No current facility-administered medications on file prior to encounter.   Current Outpatient Medications on File Prior to Encounter  Medication Sig Dispense Refill   levothyroxine (SYNTHROID, LEVOTHROID) 88 MCG tablet Take 88 mcg by mouth daily before breakfast.     losartan-hydrochlorothiazide (HYZAAR) 100-12.5 MG tablet Take 1 tablet by mouth daily.     rosuvastatin (CRESTOR) 20 MG tablet Take 20 mg by mouth  daily.     traZODone (DESYREL) 50 MG tablet Take 50 mg by mouth at bedtime.     Cholecalciferol (VITAMIN D3) 2000 units TABS Take by mouth daily.     HYDROcodone-acetaminophen (NORCO) 5-325 MG tablet Take 1-2 tablets by mouth every 6 (six) hours as needed for  moderate pain. MAXIMUM TOTAL ACETAMINOPHEN DOSE IS 4000 MG PER DAY (Patient not taking: Reported on 07/16/2017) 20 tablet 0   ibuprofen (ADVIL,MOTRIN) 200 MG tablet Take 400 mg by mouth every 6 (six) hours as needed.      Pertinent medications related to GI and procedure were reviewed by me with the patient prior to the procedure   Current Facility-Administered Medications:    0.9 %  sodium chloride infusion, , Intravenous, Continuous, Jaynie Collins, DO, Last Rate: 40 mL/hr at 07/26/23 7829, Continued from Pre-op at 07/26/23 5621  sodium chloride 40 mL/hr at 07/26/23 3086       No Known Allergies Allergies were reviewed by me prior to the procedure  Objective   Body mass index is 26.31 kg/m. Vitals:   07/26/23 0918  BP: (!) 148/97  Pulse: 93  Resp: 18  Temp: (!) 96.9 F (36.1 C)  TempSrc: Temporal  SpO2: 98%  Weight: 73.9 kg  Height: 5\' 6"  (1.676 m)     Physical Exam Vitals and nursing note reviewed.  Constitutional:      General: She is not in acute distress.    Appearance: Normal appearance. She is not ill-appearing, toxic-appearing or diaphoretic.  HENT:     Head: Normocephalic and atraumatic.     Nose: Nose normal.     Mouth/Throat:     Mouth: Mucous membranes are moist.     Pharynx: Oropharynx is clear.  Eyes:     General: No scleral icterus.    Extraocular Movements: Extraocular movements intact.  Cardiovascular:     Rate and Rhythm: Normal rate and regular rhythm.     Heart sounds: Normal heart sounds. No murmur heard.    No friction rub. No gallop.  Pulmonary:     Effort: Pulmonary effort is normal. No respiratory distress.     Breath sounds: Normal breath sounds. No wheezing, rhonchi or rales.  Abdominal:     General: Bowel sounds are normal. There is no distension.     Palpations: Abdomen is soft.     Tenderness: There is no abdominal tenderness. There is no guarding or rebound.  Musculoskeletal:     Cervical back: Neck supple.     Right  lower leg: No edema.     Left lower leg: No edema.  Skin:    General: Skin is warm and dry.     Coloration: Skin is not jaundiced or pale.  Neurological:     General: No focal deficit present.     Mental Status: She is alert and oriented to person, place, and time. Mental status is at baseline.  Psychiatric:        Mood and Affect: Mood normal.        Behavior: Behavior normal.        Thought Content: Thought content normal.        Judgment: Judgment normal.      Assessment:  Jennifer Williamson is a 72 y.o. female  who presents today for Colonoscopy for surveillance- phx colon polyps .  Plan:  Colonoscopy with possible intervention today  Colonoscopy with possible biopsy, control of bleeding, polypectomy, and interventions as necessary has been discussed with  the patient/patient representative. Informed consent was obtained from the patient/patient representative after explaining the indication, nature, and risks of the procedure including but not limited to death, bleeding, perforation, missed neoplasm/lesions, cardiorespiratory compromise, and reaction to medications. Opportunity for questions was given and appropriate answers were provided. Patient/patient representative has verbalized understanding is amenable to undergoing the procedure.   Jaynie Collins, DO  [facility] Gastroenterology  Portions of the record may have been created with voice recognition software. Occasional wrong-word or 'sound-a-like' substitutions may have occurred due to the inherent limitations of voice recognition software.  Read the chart carefully and recognize, using context, where substitutions may have occurred.

## 2023-07-26 NOTE — Anesthesia Preprocedure Evaluation (Signed)
Anesthesia Evaluation  Patient identified by MRN, date of birth, ID band Patient awake    Reviewed: Allergy & Precautions, NPO status , Patient's Chart, lab work & pertinent test results  Airway Mallampati: III  TM Distance: >3 FB     Dental  (+) Teeth Intact   Pulmonary neg pulmonary ROS   breath sounds clear to auscultation       Cardiovascular Exercise Tolerance: Good hypertension, Pt. on medications  Rhythm:Regular Rate:Normal     Neuro/Psych negative neurological ROS  negative psych ROS   GI/Hepatic negative GI ROS, Neg liver ROS,,,  Endo/Other  Hypothyroidism    Renal/GU negative Renal ROS  negative genitourinary   Musculoskeletal   Abdominal Normal abdominal exam  (+)   Peds negative pediatric ROS (+)  Hematology   Anesthesia Other Findings   Reproductive/Obstetrics                             Anesthesia Physical Anesthesia Plan  ASA: 2  Anesthesia Plan: General   Post-op Pain Management:    Induction: Intravenous  PONV Risk Score and Plan:   Airway Management Planned: Natural Airway and Nasal Cannula  Additional Equipment:   Intra-op Plan:   Post-operative Plan:   Informed Consent: I have reviewed the patients History and Physical, chart, labs and discussed the procedure including the risks, benefits and alternatives for the proposed anesthesia with the patient or authorized representative who has indicated his/her understanding and acceptance.     Dental Advisory Given  Plan Discussed with: Anesthesiologist, CRNA and Surgeon  Anesthesia Plan Comments: (Patient consented for risks of anesthesia including but not limited to:  - adverse reactions to medications - risk of airway placement if required - damage to eyes, teeth, lips or other oral mucosa - nerve damage due to positioning  - sore throat or hoarseness - Damage to heart, brain, nerves, lungs, other  parts of body or loss of life  Patient voiced understanding and assent.)       Anesthesia Quick Evaluation

## 2023-07-26 NOTE — Transfer of Care (Signed)
Immediate Anesthesia Transfer of Care Note  Patient: Jennifer Williamson  Procedure(s) Performed: COLONOSCOPY WITH PROPOFOL POLYPECTOMY  Patient Location: PACU  Anesthesia Type:General  Level of Consciousness: sedated  Airway & Oxygen Therapy: Patient Spontanous Breathing  Post-op Assessment: Report given to RN and Post -op Vital signs reviewed and stable  Post vital signs: Reviewed and stable  Last Vitals:  Vitals Value Taken Time  BP 85/60 07/26/23 1002  Temp    Pulse 107 07/26/23 1002  Resp 15 07/26/23 1002  SpO2 96 % 07/26/23 1002  Vitals shown include unfiled device data.  Last Pain:  Vitals:   07/26/23 0918  TempSrc: Temporal  PainSc: 0-No pain         Complications: No notable events documented.

## 2023-07-29 ENCOUNTER — Encounter: Payer: Self-pay | Admitting: Gastroenterology

## 2023-07-29 LAB — SURGICAL PATHOLOGY

## 2023-10-29 DIAGNOSIS — H40153 Residual stage of open-angle glaucoma, bilateral: Secondary | ICD-10-CM | POA: Diagnosis not present

## 2023-11-21 DIAGNOSIS — R7303 Prediabetes: Secondary | ICD-10-CM | POA: Diagnosis not present

## 2023-11-21 DIAGNOSIS — E042 Nontoxic multinodular goiter: Secondary | ICD-10-CM | POA: Diagnosis not present

## 2023-11-21 DIAGNOSIS — I10 Essential (primary) hypertension: Secondary | ICD-10-CM | POA: Diagnosis not present

## 2023-11-28 DIAGNOSIS — E042 Nontoxic multinodular goiter: Secondary | ICD-10-CM | POA: Diagnosis not present

## 2023-11-28 DIAGNOSIS — E7800 Pure hypercholesterolemia, unspecified: Secondary | ICD-10-CM | POA: Diagnosis not present

## 2023-11-28 DIAGNOSIS — R7303 Prediabetes: Secondary | ICD-10-CM | POA: Diagnosis not present

## 2024-02-18 DIAGNOSIS — H40153 Residual stage of open-angle glaucoma, bilateral: Secondary | ICD-10-CM | POA: Diagnosis not present

## 2024-04-15 DIAGNOSIS — R7303 Prediabetes: Secondary | ICD-10-CM | POA: Diagnosis not present

## 2024-04-15 DIAGNOSIS — E7800 Pure hypercholesterolemia, unspecified: Secondary | ICD-10-CM | POA: Diagnosis not present

## 2024-04-15 DIAGNOSIS — E042 Nontoxic multinodular goiter: Secondary | ICD-10-CM | POA: Diagnosis not present

## 2024-04-16 ENCOUNTER — Other Ambulatory Visit: Payer: Self-pay | Admitting: Internal Medicine

## 2024-04-16 DIAGNOSIS — Z0000 Encounter for general adult medical examination without abnormal findings: Secondary | ICD-10-CM | POA: Diagnosis not present

## 2024-04-16 DIAGNOSIS — E7800 Pure hypercholesterolemia, unspecified: Secondary | ICD-10-CM | POA: Diagnosis not present

## 2024-04-16 DIAGNOSIS — Z1231 Encounter for screening mammogram for malignant neoplasm of breast: Secondary | ICD-10-CM | POA: Diagnosis not present

## 2024-04-16 DIAGNOSIS — Z1331 Encounter for screening for depression: Secondary | ICD-10-CM | POA: Diagnosis not present

## 2024-04-16 DIAGNOSIS — E042 Nontoxic multinodular goiter: Secondary | ICD-10-CM | POA: Diagnosis not present

## 2024-04-16 DIAGNOSIS — R7303 Prediabetes: Secondary | ICD-10-CM | POA: Diagnosis not present

## 2024-05-12 ENCOUNTER — Ambulatory Visit
Admission: RE | Admit: 2024-05-12 | Discharge: 2024-05-12 | Disposition: A | Discharge status: Home / Self Care | Source: Ambulatory Visit | Attending: Internal Medicine | Admitting: Internal Medicine

## 2024-05-12 DIAGNOSIS — Z1231 Encounter for screening mammogram for malignant neoplasm of breast: Secondary | ICD-10-CM | POA: Diagnosis not present

## 2024-06-24 DIAGNOSIS — R350 Frequency of micturition: Secondary | ICD-10-CM | POA: Diagnosis not present

## 2024-06-29 DIAGNOSIS — H40153 Residual stage of open-angle glaucoma, bilateral: Secondary | ICD-10-CM | POA: Diagnosis not present
